# Patient Record
Sex: Female | Born: 1970
Health system: Southern US, Community
[De-identification: ages and names within clinical notes are randomized; demographics above are authoritative.]

## PROBLEM LIST (undated history)

## (undated) DIAGNOSIS — O99810 Abnormal glucose complicating pregnancy: Secondary | ICD-10-CM

## (undated) DIAGNOSIS — O24419 Gestational diabetes mellitus in pregnancy, unspecified control: Secondary | ICD-10-CM

## (undated) DIAGNOSIS — L309 Dermatitis, unspecified: Secondary | ICD-10-CM

## (undated) DIAGNOSIS — J45990 Exercise induced bronchospasm: Secondary | ICD-10-CM

## (undated) DIAGNOSIS — E282 Polycystic ovarian syndrome: Secondary | ICD-10-CM

## (undated) DIAGNOSIS — T7840XA Allergy, unspecified, initial encounter: Secondary | ICD-10-CM

## (undated) DIAGNOSIS — I10 Essential (primary) hypertension: Secondary | ICD-10-CM

## (undated) HISTORY — DX: Abnormal glucose complicating pregnancy: O99.810

## (undated) HISTORY — DX: Essential (primary) hypertension: I10

## (undated) HISTORY — DX: Allergy, unspecified, initial encounter: T78.40XA

## (undated) HISTORY — DX: Polycystic ovarian syndrome: E28.2

## (undated) HISTORY — DX: Gestational diabetes mellitus in pregnancy, unspecified control: O24.419

## (undated) HISTORY — DX: Exercise induced bronchospasm: J45.990

## (undated) HISTORY — PX: NO PAST SURGERIES: SHX2092

## (undated) HISTORY — PX: WISDOM TOOTH EXTRACTION: SHX21

## (undated) HISTORY — DX: Dermatitis, unspecified: L30.9

---

## 2007-03-05 ENCOUNTER — Ambulatory Visit: Payer: Self-pay | Admitting: Family Medicine

## 2007-03-05 DIAGNOSIS — N751 Abscess of Bartholin's gland: Secondary | ICD-10-CM | POA: Insufficient documentation

## 2007-03-05 DIAGNOSIS — Z8632 Personal history of gestational diabetes: Secondary | ICD-10-CM | POA: Insufficient documentation

## 2007-03-05 DIAGNOSIS — O99810 Abnormal glucose complicating pregnancy: Secondary | ICD-10-CM

## 2007-03-05 DIAGNOSIS — R300 Dysuria: Secondary | ICD-10-CM | POA: Insufficient documentation

## 2007-03-05 HISTORY — DX: Abnormal glucose complicating pregnancy: O99.810

## 2007-03-06 ENCOUNTER — Encounter: Payer: Self-pay | Admitting: Family Medicine

## 2007-03-08 LAB — CONVERTED CEMR LAB
Clue Cells Wet Prep HPF POC: NONE SEEN
Trich, Wet Prep: NONE SEEN
Yeast Wet Prep HPF POC: NONE SEEN

## 2007-05-03 ENCOUNTER — Ambulatory Visit: Payer: Self-pay | Admitting: Family Medicine

## 2007-05-03 DIAGNOSIS — J301 Allergic rhinitis due to pollen: Secondary | ICD-10-CM | POA: Insufficient documentation

## 2007-05-07 ENCOUNTER — Encounter: Payer: Self-pay | Admitting: Family Medicine

## 2007-05-07 LAB — CONVERTED CEMR LAB
ALT: 12 units/L (ref 0–35)
Alkaline Phosphatase: 83 units/L (ref 39–117)
CO2: 23 meq/L (ref 19–32)
Cholesterol: 198 mg/dL (ref 0–200)
Creatinine, Ser: 0.88 mg/dL (ref 0.40–1.20)
LDL Cholesterol: 117 mg/dL — ABNORMAL HIGH (ref 0–99)
Sodium: 139 meq/L (ref 135–145)
Total Bilirubin: 0.4 mg/dL (ref 0.3–1.2)
Total CHOL/HDL Ratio: 4.8
Total Protein: 7.7 g/dL (ref 6.0–8.3)
VLDL: 40 mg/dL (ref 0–40)

## 2007-05-09 ENCOUNTER — Encounter: Payer: Self-pay | Admitting: Family Medicine

## 2007-06-17 ENCOUNTER — Ambulatory Visit: Payer: Self-pay | Admitting: Family Medicine

## 2007-06-17 DIAGNOSIS — N949 Unspecified condition associated with female genital organs and menstrual cycle: Secondary | ICD-10-CM

## 2007-06-17 DIAGNOSIS — N938 Other specified abnormal uterine and vaginal bleeding: Secondary | ICD-10-CM | POA: Insufficient documentation

## 2007-10-13 ENCOUNTER — Ambulatory Visit: Payer: Self-pay | Admitting: Family Medicine

## 2007-10-13 DIAGNOSIS — N390 Urinary tract infection, site not specified: Secondary | ICD-10-CM | POA: Insufficient documentation

## 2007-10-13 DIAGNOSIS — H6980 Other specified disorders of Eustachian tube, unspecified ear: Secondary | ICD-10-CM | POA: Insufficient documentation

## 2007-10-13 LAB — CONVERTED CEMR LAB
Bilirubin Urine: NEGATIVE
Glucose, Urine, Semiquant: NEGATIVE
Protein, U semiquant: >=300
Urobilinogen, UA: 0.2
pH: 6

## 2007-10-15 ENCOUNTER — Encounter: Payer: Self-pay | Admitting: Family Medicine

## 2007-10-15 ENCOUNTER — Ambulatory Visit: Payer: Self-pay | Admitting: Family Medicine

## 2007-10-15 DIAGNOSIS — R0602 Shortness of breath: Secondary | ICD-10-CM | POA: Insufficient documentation

## 2007-10-18 ENCOUNTER — Telehealth: Payer: Self-pay | Admitting: Family Medicine

## 2007-10-21 ENCOUNTER — Telehealth (INDEPENDENT_AMBULATORY_CARE_PROVIDER_SITE_OTHER): Payer: Self-pay | Admitting: *Deleted

## 2007-11-16 ENCOUNTER — Encounter: Payer: Self-pay | Admitting: Family Medicine

## 2008-06-16 ENCOUNTER — Ambulatory Visit: Payer: Self-pay | Admitting: Family Medicine

## 2008-06-16 DIAGNOSIS — J069 Acute upper respiratory infection, unspecified: Secondary | ICD-10-CM | POA: Insufficient documentation

## 2008-06-16 DIAGNOSIS — S90129A Contusion of unspecified lesser toe(s) without damage to nail, initial encounter: Secondary | ICD-10-CM | POA: Insufficient documentation

## 2008-06-16 DIAGNOSIS — J209 Acute bronchitis, unspecified: Secondary | ICD-10-CM | POA: Insufficient documentation

## 2008-07-06 ENCOUNTER — Encounter: Payer: Self-pay | Admitting: Family Medicine

## 2008-07-17 ENCOUNTER — Ambulatory Visit: Payer: Self-pay | Admitting: Family Medicine

## 2008-07-17 DIAGNOSIS — R05 Cough: Secondary | ICD-10-CM

## 2008-07-17 DIAGNOSIS — R059 Cough, unspecified: Secondary | ICD-10-CM | POA: Insufficient documentation

## 2008-07-18 LAB — CONVERTED CEMR LAB
Basophils Absolute: 0 10*3/uL (ref 0.0–0.1)
Basophils Relative: 0 % (ref 0–1)
Eosinophils Absolute: 0.1 10*3/uL (ref 0.0–0.7)
Eosinophils Relative: 1 % (ref 0–5)
HCT: 39.3 % (ref 36.0–46.0)
MCV: 91.8 fL (ref 78.0–100.0)
Neutrophils Relative %: 74 % (ref 43–77)
Platelets: 237 10*3/uL (ref 150–400)
RDW: 14.5 % (ref 11.5–15.5)

## 2008-12-29 ENCOUNTER — Ambulatory Visit: Payer: Self-pay | Admitting: Family Medicine

## 2010-04-01 ENCOUNTER — Encounter: Payer: Self-pay | Admitting: Family Medicine

## 2010-04-25 NOTE — Letter (Signed)
Summary: [facility]  [facility]   Imported By: Lanelle Bal 04/16/2010 11:07:09  _____________________________________________________________________  External Attachment:    Type:   Image     Comment:   External Document

## 2010-05-20 ENCOUNTER — Ambulatory Visit (INDEPENDENT_AMBULATORY_CARE_PROVIDER_SITE_OTHER): Payer: Private Health Insurance - Indemnity | Admitting: Family Medicine

## 2010-05-20 ENCOUNTER — Encounter: Payer: Self-pay | Admitting: Family Medicine

## 2010-05-20 DIAGNOSIS — G5600 Carpal tunnel syndrome, unspecified upper limb: Secondary | ICD-10-CM

## 2010-05-24 ENCOUNTER — Telehealth: Payer: Self-pay | Admitting: Family Medicine

## 2010-05-30 ENCOUNTER — Telehealth: Payer: Self-pay | Admitting: Family Medicine

## 2010-05-30 NOTE — Assessment & Plan Note (Signed)
Summary: L hand pain   Vital Signs:  Patient profile:   40 year old female Height:      65.5 inches Weight:      160 pounds BMI:     26.32 O2 Sat:      98 % on Room air Pulse rate:   85 / minute BP sitting:   121 / 87  (left arm) Cuff size:   regular  Vitals Entered By: Payton Spark CMA (May 20, 2010 2:10 PM)  O2 Flow:  Room air CC: Stabbing pians in L hand radiate up arm x 1 week.   Primary Care Provider:  Seymour Bars DO  CC:  Stabbing pians in L hand radiate up arm x 1 week.Pamela Stevens  History of Present Illness: 40 yo WF presents for pain in the L hand that started 6 days ago.  She was doing a lot of typing.  She is R handed.  She had L sided carpal tunnel when she was present.  She has pain in the 1st and 2nd digit, mostly over the thenar eminence  and the pains shoot all the way up the L arm.  No tingling or numbness.  She feels achey in her arm.  Tylenol did not help.  No relief with ibuprofen.  She has had pain in the neck.  and radiating pain in the shoulder but no actual shoulder pain.  Denie forearm or hand swelling.    Dropped a pickle jar last wk due to loss of strength.  Has been holding her toddler on the L side and he is about 27 lbs now.     Current Medications (verified): 1)  Gnp Prenatal Vitamins   Tabs (Prenatal Vit-Fe Fumarate-Fa) .... Take 1 Tablet By Mouth Once A Day 2)  Camila 0.35 Mg Tabs (Norethindrone) 3)  Fish Oil 1000 Mg Caps (Omega-3 Fatty Acids)  Allergies (verified): No Known Drug Allergies  Past History:  Past Medical History: Reviewed history from 06/16/2008 and no changes required. Insulin dependent GDM migraines irregular menses  Spirometry 10-15-07: FVC 83%, FEV1 87%, ratio 105%, no albuterol response.   Social History: SAHM.  Toddler daughter ' Cammy Copa ' and son.  Married to [place]. Never smoked. Rare ETOH. 2 coffee/ day Plan to start going to gym. Fair diet, nursing.    Review of Systems      See HPI  Physical Exam  General:   alert, well-developed, well-nourished, and well-hydrated.   Head:  normocephalic and atraumatic.   Neck:  supple and no masses.   Lungs:  Normal respiratory effort, chest expands symmetrically. Lungs are clear to auscultation, no crackles or wheezes. Heart:  Normal rate and regular rhythm. S1 and S2 normal without gallop, murmur, click, rub or other extra sounds. Msk:  slightly limited C spine Rotation and SB bilat with some pain.  tight trap muscles, full glenohumeral ROM Pulses:  2+ radial and ulnar pulses Extremities:  no UE Edema Neurologic:  grip + 5/5 bilat + Phalen sign on the L Skin:  color normal.   Psych:  good eye contact, not anxious appearing, and not depressed appearing.     Impression & Recommendations:  Problem # 1:  CARPAL TUNNEL SYNDROME, LEFT (ICD-354.0) L carpal tunnel vs cervical radiculopathy.  Not responding to OTC tylenol (breastfeeding) or a night splint. Will order a nerve conduction study at [facility] to decipher where pain is coming from.   Will start her on Naproxen two times a day x 2 wks.  Also consider  dx of tendonopathy though with resting pain and tingling,  more likely to be nerve root compression.   Orders: Neurology Referral (Neuro)  Complete Medication List: 1)  Gnp Prenatal Vitamins Tabs (Prenatal vit-fe fumarate-fa) .... Take 1 tablet by mouth once a day 2)  Camila 0.35 Mg Tabs (Norethindrone) 3)  Fish Oil 1000 Mg Caps (Omega-3 fatty acids) 4)  Naproxen 500 Mg Tabs (Naproxen) .Pamela Stevens.. 1 tab by mouth two times a day with food x 2 wks  Other Orders: T-Comprehensive Metabolic Panel [id number]) T-Lipid Profile ([id number])  Patient Instructions: 1)  Will set up a nerve conduction study to figure out if pain is coming from your neck or your carpel tunnel. 2)  Start RX Naproxen 2 x a day with food for pain/ inflammation. 3)  REturn for f/u in 3 wks. Prescriptions: NAPROXEN 500 MG TABS (NAPROXEN) 1 tab by mouth two times a day with food x 2 wks  #28 x  0   Entered and Authorized by:   Seymour Bars DO   Signed by:   Seymour Bars DO on 05/20/2010   Method used:   [facility] to        [facility] [phone][facility] (retail)       [address] [place], [place]  [address]       Ph: [phone] or [phone]       Fax: [phone]   RxID:   [id number]    Orders Added: 1)  T-Comprehensive Metabolic Panel [[id number]] 2)  T-Lipid Profile [[id number]] 3)  Neurology Referral [Neuro] 4)  Est. Patient Level III [[address]606]

## 2010-05-30 NOTE — Progress Notes (Signed)
Summary: [facility]-Hand pain/swelling  Phone Note Call from Patient Call back at Home Phone [phone]   Caller: Patient Call For: Pamela Bars DO Summary of Call: Pt states she was cutting something last night and had a sharp pain that radiated to fingertips and up her arm.  After that pain she noticed that her arm/hand was swollen.  Swelling has gone down some today.  Pain continues to radiate to fingertip and she continues to get intermittent pinprick pain in all of her fingertips.  Pt would like to know if there is anything she may be able to take for pain.  Either a pain med or muscle relaxer.  Advised pt our options are limited due to breastfeeding.  Pt voices understanding.  Pt would like something for pain and would like to know if this pain is normal for carpal tunnel.  Please advise. [facility] [place] Initial call taken by: Francee Piccolo CMA Duncan Dull),  May 24, 2010 2:10 PM  Follow-up for Phone Call        1.  pt is alredy set up for EMG/ nerve conduction study 2.  I already gave her Naproxen for pain and inflammation. 3.  She can wear her night splint day and night if needed.  Nothing additional to add. Follow-up by: Pamela Bars DO,  May 24, 2010 4:33 PM     Appended Document: [facility]-Hand pain/swelling pt notified of above.  She has EMG study monday.

## 2010-06-03 DIAGNOSIS — G5620 Lesion of ulnar nerve, unspecified upper limb: Secondary | ICD-10-CM | POA: Insufficient documentation

## 2010-06-04 NOTE — Progress Notes (Addendum)
Summary: Hand pain  Phone Note Call from Patient   Caller: Patient Summary of Call: Pt states she had nerve conduction study and it didn't really show anything so she is wondering what the next step for her hand pain is. Please advise. Initial call taken by: Payton Spark CMA,  May 30, 2010 1:38 PM  Follow-up for Phone Call        Pls let pt know that I will put in a referral for ortho tomorrow. Follow-up by: Seymour Bars DO,  May 30, 2010 2:06 PM     Appended Document: Hand pain Pt aware of the above  Appended Document: Hand pain Pls let pt know that her nerve conduction study did show evidence of a mild L ulnar neuropathy, not sure if this is the cause of her pain.  Let me know if any problems getting in with ortho.  Will fax them a copy of her study.  Seymour Bars, D.O.  Appended Document: Hand pain

## 2010-06-07 ENCOUNTER — Telehealth (INDEPENDENT_AMBULATORY_CARE_PROVIDER_SITE_OTHER): Payer: Self-pay | Admitting: *Deleted

## 2010-06-11 NOTE — Progress Notes (Signed)
Summary: Naproxen refill  Phone Note Refill Request Call back at Home Phone [phone] Message from:  Patient on June 07, 2010 8:34 AM  Refills Requested: Medication #1:  NAPROXEN 500 MG TABS 1 tab by mouth two times a day with food x 2 wks.  Method Requested: Electronic Initial call taken by: Lannette Donath,  June 07, 2010 8:34 AM    Prescriptions: NAPROXEN 500 MG TABS (NAPROXEN) 1 tab by mouth two times a day with food x 2 wks  #28 x 0   Entered by:   Payton Spark CMA   Authorized by:   Seymour Bars DO   Signed by:   Payton Spark CMA on 06/07/2010   Method used:   Electronically to        [facility] [phone]* (retail)       [address] [place], [place]  [address]       Ph: [phone] or [phone]       Fax: [phone]   RxID:   [id number]

## 2010-08-06 ENCOUNTER — Encounter: Payer: Self-pay | Admitting: Family Medicine

## 2010-08-06 ENCOUNTER — Ambulatory Visit (INDEPENDENT_AMBULATORY_CARE_PROVIDER_SITE_OTHER): Payer: Private Health Insurance - Indemnity | Admitting: Family Medicine

## 2010-08-06 VITALS — BP 119/80 | HR 88 | Temp 98.8°F | Ht 66.0 in | Wt 162.0 lb

## 2010-08-06 DIAGNOSIS — H6590 Unspecified nonsuppurative otitis media, unspecified ear: Secondary | ICD-10-CM

## 2010-08-06 NOTE — Progress Notes (Signed)
  Subjective:    Patient ID: Pamela Stevens, female    DOB: June 25, 1970, 40 y.o.   MRN: [id number]  HPI  40 yo WF presents for bilat ear pain.  She had a cold a few wks ago.  She is breastfeeding.  Both of her kids have had URI/ ear infections.  She had had chills but no fevers.  She has muffled hearing and popping sensation.  She no longer has sore throat or rhinorrhea.  She denies dizziness but has tinnitus.  She's only had one previous ear infection.  BP 119/80  Pulse 88  Temp(Src) 98.8 F (37.1 C) (Oral)  Ht 5\' 6"  (1.676 m)  Wt 162 lb (73.483 kg)  BMI 26.15 kg/m2  SpO2 100%  LMP 08/04/2010    Review of Systems as per HPI     Objective:   Physical Exam  Constitutional: She appears well-developed and well-nourished. No distress.  HENT:  Head: Normocephalic and atraumatic.  Right Ear: External ear and ear canal normal. No drainage or tenderness. Tympanic membrane is not injected and not perforated. A middle ear effusion is present. Decreased hearing is noted.  Left Ear: External ear and ear canal normal. No tenderness. Tympanic membrane is not injected and not perforated. A middle ear effusion is present. Decreased hearing is noted.  Nose: Mucosal edema and rhinorrhea present. Right sinus exhibits no maxillary sinus tenderness and no frontal sinus tenderness. Left sinus exhibits no maxillary sinus tenderness and no frontal sinus tenderness.  Mouth/Throat: Mucous membranes are normal. No uvula swelling. No oropharyngeal exudate, posterior oropharyngeal edema or posterior oropharyngeal erythema.  Cardiovascular: Normal rate, regular rhythm and normal heart sounds.   No murmur heard. Pulmonary/Chest: Effort normal and breath sounds normal.  Lymphadenopathy:    She has cervical adenopathy.  Skin: Skin is warm and dry. No rash noted.  Psychiatric: She has a normal mood and affect.          Assessment & Plan:  1.  bilat ear effusions following a URI - no sign of bacterial  infection.  Will treat with claritin D OTC.  If not seeing improvement in symptoms after 4-5 days, please call.

## 2010-08-06 NOTE — Patient Instructions (Signed)
Start Clartin D for ear effusion.  Call me if pain/ pressure as not resolved by Friday.

## 2010-08-09 ENCOUNTER — Telehealth: Payer: Self-pay | Admitting: *Deleted

## 2010-08-09 DIAGNOSIS — H9209 Otalgia, unspecified ear: Secondary | ICD-10-CM

## 2010-08-09 NOTE — Telephone Encounter (Signed)
Ok.  As discussed, I will put in for an ENT referral.

## 2010-08-09 NOTE — Telephone Encounter (Signed)
Pt aware of the above  

## 2010-08-09 NOTE — Telephone Encounter (Signed)
Pt states ear is no better. Please advise.

## 2011-05-16 ENCOUNTER — Encounter: Payer: Self-pay | Admitting: Family Medicine

## 2011-05-16 ENCOUNTER — Ambulatory Visit (INDEPENDENT_AMBULATORY_CARE_PROVIDER_SITE_OTHER): Payer: Private Health Insurance - Indemnity | Admitting: Family Medicine

## 2011-05-16 VITALS — BP 121/86 | HR 87 | Ht 66.0 in | Wt 165.0 lb

## 2011-05-16 DIAGNOSIS — N390 Urinary tract infection, site not specified: Secondary | ICD-10-CM

## 2011-05-16 DIAGNOSIS — B351 Tinea unguium: Secondary | ICD-10-CM

## 2011-05-16 DIAGNOSIS — L259 Unspecified contact dermatitis, unspecified cause: Secondary | ICD-10-CM

## 2011-05-16 DIAGNOSIS — L309 Dermatitis, unspecified: Secondary | ICD-10-CM

## 2011-05-16 LAB — POCT URINALYSIS DIPSTICK
Spec Grav, UA: >=1.03
Urobilinogen, UA: 0.2
pH, UA: 5.5

## 2011-05-16 LAB — COMPLETE METABOLIC PANEL WITH GFR
Albumin: 5 g/dL (ref 3.5–5.2)
Alkaline Phosphatase: 71 U/L (ref 39–117)
BUN: 17 mg/dL (ref 6–23)
Creat: 1.05 mg/dL (ref 0.50–1.10)
GFR, Est Non African American: 67 mL/min
Glucose, Bld: 99 mg/dL (ref 70–99)
Potassium: 4.7 mEq/L (ref 3.5–5.3)
Total Bilirubin: 0.4 mg/dL (ref 0.3–1.2)

## 2011-05-16 MED ORDER — CIPROFLOXACIN HCL 500 MG PO TABS: 500.0000 mg | ORAL_TABLET | Freq: Two times a day (BID) | ORAL | Status: AC

## 2011-05-16 MED ORDER — TERBINAFINE HCL 250 MG PO TABS: 250.0000 mg | ORAL_TABLET | Freq: Every day | ORAL | Status: DC

## 2011-05-16 NOTE — Progress Notes (Signed)
  Subjective:    Patient ID: Pamela Stevens, female    DOB: 03-20-1971, 41 y.o.   MRN: [id number]  HPI 4 weeks of dysuria, urgency. No fever.  Mild back pain.  Also having some neck pain as well.  No hematuria.    Right great toe with nail fungus. Couldn't treat it before since was pregnant. She is not breast feeding. It has never been cultured. She would like to try fungal treatment. She has not hx of liver problems.   Rash on right fingerpad shw would like me to look at. She says it has been getting worse lately. She has not been using anything on it. She would like to which she can use. No prior history of eczema.  Review of Systems     Objective:   Physical Exam  Constitutional: She appears well-developed and well-nourished.  Cardiovascular: Normal rate, regular rhythm and normal heart sounds.   Pulmonary/Chest: Effort normal and breath sounds normal.  Musculoskeletal:       No CVA tenderness.   Skin:       On her great toe on her right foot she has some white thickening of the lateral nail border. On her first thing on her right hand she also has some skin that is peeling, thickened with a few vesicles. No actual breaks in the skin or open wounds.          Assessment & Plan:  UTI- UA + for trace LE and blood. Will tx with Cipro x 3 days. Call if symptoms not completely resolved in 5 days. Work on hydrating and emptying the bladder regularly.  Onychomycosis-we discussed using Lamisil to try to treat her nail. We did not collect a fungal culture. If their insurance requires this then she can come back and we can collect a sample.Check baseline LFTs.    Dyshidrotic eczema on the right finger-to assist him with topical steroid and using lots of lotions or emoillents.  She would like to hold off on steroid if possible and we'll try just lots of lotion moisturizers on the finger first. Angelica Leal his back if she changes her mind and we can consider a trial of triamcinolone  cream.  Cholesterol-she said she had blood work checked at work about 4 weeks ago. I asked her to fax [place] a copy of this for her records.

## 2011-05-16 NOTE — Progress Notes (Signed)
Quick Note:  All labs are normal. ______ 

## 2011-05-16 NOTE — Patient Instructions (Signed)
Call if symptoms not completely resolved in 5 days. Work on hydrating and emptying the bladder regularly.

## 2012-08-06 ENCOUNTER — Encounter: Payer: Self-pay | Admitting: Family Medicine

## 2012-08-06 ENCOUNTER — Ambulatory Visit (INDEPENDENT_AMBULATORY_CARE_PROVIDER_SITE_OTHER): Payer: Private Health Insurance - Indemnity | Admitting: Family Medicine

## 2012-08-06 VITALS — BP 126/93 | HR 79 | Wt 167.0 lb

## 2012-08-06 DIAGNOSIS — B351 Tinea unguium: Secondary | ICD-10-CM

## 2012-08-06 DIAGNOSIS — H109 Unspecified conjunctivitis: Secondary | ICD-10-CM

## 2012-08-06 MED ORDER — TERBINAFINE HCL 250 MG PO TABS: 250.0000 mg | ORAL_TABLET | Freq: Every day | ORAL | Status: AC

## 2012-08-06 MED ORDER — MOXIFLOXACIN HCL 0.5 % OP SOLN - NO CHARGE: 1.0000 [drp] | Freq: Three times a day (TID) | OPHTHALMIC | Status: DC

## 2012-08-06 NOTE — Progress Notes (Signed)
  Subjective:    Patient ID: Pamela Stevens, female    DOB: [birth year], 42 y.o.   MRN: [id number]  HPI Conjunctivitis that started last night. Both of her kids have been battling the same symptoms are currently on treatment. It started in her right eye and is now moving to her left. Feels itchy and scratchey.  Vision is a litlte blurry.  Lid feels swollen. No fever Or URI. Does have mild seasonal allergies.   ONychomycosis - Says was initially treated with Lamisil a couple years ago by Dr. Seymour Bars. She feels like it's starting to come back and wants to know if she can refill the Lamisil prescription.   Review of Systems     Objective:   Physical Exam  Constitutional: She is oriented to person, place, and time. She appears well-developed and well-nourished.  HENT:  Head: Normocephalic and atraumatic.  Right Ear: External ear normal.  Left Ear: External ear normal.  Nose: Nose normal.  Mouth/Throat: Oropharynx is clear and moist.  TMs and canals are clear. Sclera are injected bilaterally but worse on the right. Her right upper and lower eyelids are mildly edematous and erythematous as well. No active drainage from the corner of the eye. Some crusting along the liver. On the right.  Eyes: Conjunctivae and EOM are normal. Pupils are equal, round, and reactive to light.  Neck: Neck supple. No thyromegaly present.  Pulmonary/Chest: She has no wheezes.  Lymphadenopathy:    She has no cervical adenopathy.  Neurological: She is alert and oriented to person, place, and time.  Skin: Skin is warm and dry.  Right foot great toenail it black and thick on the end.   Psychiatric: She has a normal mood and affect.          Assessment & Plan:  Conjunctivitis - will treat with Vigamox eyedrops. If not significantly better in one week then please call me back. If she notices any significant change in vision and please call immediately.  Onychomycosis-will recheck liver enzymes today  before starting Lamisil. Oral prescription sent to pharmacy. Call if any problems with the medication.

## 2012-08-06 NOTE — Patient Instructions (Signed)
Conjunctivitis Conjunctivitis is commonly called "pink eye." Conjunctivitis can be caused by bacterial or viral infection, allergies, or injuries. There is usually redness of the lining of the eye, itching, discomfort, and sometimes discharge. There may be deposits of matter along the eyelids. A viral infection usually causes a watery discharge, while a bacterial infection causes a yellowish, thick discharge. Pink eye is very contagious and spreads by direct contact. You may be given antibiotic eyedrops as part of your treatment. Before using your eye medicine, remove all drainage from the eye by washing gently with warm water and cotton balls. Continue to use the medication until you have awakened 2 mornings in a row without discharge from the eye. Do not rub your eye. This increases the irritation and helps spread infection. Use separate towels from other household members. Wash your hands with soap and water before and after touching your eyes. Use cold compresses to reduce pain and sunglasses to relieve irritation from light. Do not wear contact lenses or wear eye makeup until the infection is gone. SEEK MEDICAL CARE IF:   Your symptoms are not better after 3 days of treatment.  You have increased pain or trouble seeing.  The outer eyelids become very red or swollen. Document Released: 04/17/2004 Document Revised: 06/02/2011 Document Reviewed: 03/10/2005 Exit[employer] Patient Information 2013 [employer].  

## 2012-08-07 LAB — HEPATIC FUNCTION PANEL
ALT: 13 U/L (ref 0–35)
AST: 16 U/L (ref 0–37)
Indirect Bilirubin: 0.4 mg/dL (ref 0.0–0.9)
Total Protein: 7.7 g/dL (ref 6.0–8.3)

## 2012-08-08 NOTE — Progress Notes (Signed)
Quick Note:  All labs are normal. ______ 

## 2012-08-10 ENCOUNTER — Other Ambulatory Visit: Payer: Self-pay | Admitting: *Deleted

## 2012-08-10 MED ORDER — MOXIFLOXACIN HCL 0.5 % OP SOLN - NO CHARGE: 1.0000 [drp] | Freq: Three times a day (TID) | OPHTHALMIC | Status: DC

## 2012-10-04 ENCOUNTER — Encounter: Payer: Self-pay | Admitting: Family Medicine

## 2012-10-04 ENCOUNTER — Ambulatory Visit (INDEPENDENT_AMBULATORY_CARE_PROVIDER_SITE_OTHER): Payer: Worker's Compensation | Admitting: Family Medicine

## 2012-10-04 VITALS — BP 125/91 | HR 76 | Wt 169.0 lb

## 2012-10-04 DIAGNOSIS — M5030 Other cervical disc degeneration, unspecified cervical region: Secondary | ICD-10-CM

## 2012-10-04 DIAGNOSIS — S060X0A Concussion without loss of consciousness, initial encounter: Secondary | ICD-10-CM

## 2012-10-04 MED ORDER — HYDROCODONE-ACETAMINOPHEN 5-325 MG PO TABS: ORAL_TABLET | ORAL | Status: DC

## 2012-10-04 MED ORDER — ONDANSETRON HCL 4 MG PO TABS: ORAL_TABLET | ORAL | Status: DC

## 2012-10-04 NOTE — Progress Notes (Signed)
Claim [id number] Pamela Stevens [phone] Ext [id number]  CC: Pamela Stevens is a 42 y.o. female is here for ER f/u   Subjective: HPI:  Patient complains of nausea, posterior headache, mental sluggishness, dizziness and trouble concentrating and has been present ever since hitting her head on the back of a table and then on the ground without losing consciousness after falling from a table at work. This occurred July 11 she was evaluated at [facility] with records showing unremarkable right elbow x-ray CT scan of the cervical spine showing no acute fracture but multilevel degenerative disc disease most prominent C5-C6 central stenosis.  Fortunately she denies any motor or sensory disturbances in the appendages nor weakness nor saddle paresthesia nor bowel or bladder incontinence recently or remotely. She tells me CT scan of the head was also obtained which was negative results are not available for this.  Symptoms began somewhat suddenly after the injury above. Treatment has included staying at home. Symptoms were worsened when returning to work this morning. Symptoms are somewhat worsened with emotional liability when watching TV, dizziness is worsened with sudden movements otherwise all symptoms seem to be constant and unchanged mild to moderate severity since onset. Symptoms are present all hours of the day. She reports she had a concussion approximately 20 years ago but no head injury since then. She denies fevers, chills, abdominal pain, chest pain, neck pain, coordination difficulty, irregular heartbeat, nor vision loss or hearing loss. She's been using ibuprofen without much help of her headache which is described as posterior nonradiating mild in severity nothing makes better or worse other than above   Review Of Systems Outlined In HPI  Past Medical History  Diagnosis Date  . DIABETES MELLITUS, GESTATIONAL, INSULIN-DEPENDENT 03/05/2007    Qualifier: Diagnosis of  By: [name]       History reviewed. No pertinent family history.   History  Substance Use Topics  . Smoking status: Never Smoker   . Smokeless tobacco: Not on file  . Alcohol Use: Not on file     Objective: Filed Vitals:   10/04/12 1135  BP: 125/91  Pulse: 76    General: Alert and Oriented, No Acute Distress HEENT: Pupils equal, round, reactive to light. Conjunctivae clear.  External ears unremarkable, canals clear with intact TMs with appropriate landmarks.  Middle ear appears open without effusion. Pink inferior turbinates.  Moist mucous membranes, pharynx without inflammation nor lesions.  Neck supple without palpable lymphadenopathy nor abnormal masses. Lungs: Clear to auscultation bilaterally, no wheezing/ronchi/rales.  Comfortable work of breathing. Good air movement. Cardiac: Regular rate and rhythm. Normal S1/S2.  No murmurs, rubs, nor gallops.   Neuro: CN II-XII grossly intact, full strength/rom of all four extremities, C5/L4/S1 DTRs 2/4 bilaterally, gait normal, rapid alternating movements normal, heel-shin test normal, Rhomberg normal. Extremities: No peripheral edema.  Strong peripheral pulses.  Mental Status: No depression, anxiety, nor agitation. Skin: Warm and dry.  Orientation 5 out of 5 Immediate memory 15 out of 15 Concentration: Able to recite up to 5 random digits backwards, months in reverse order 12 out of 12 She has full range of motion and strength of the neck without midline tenderness. With her nondominant hand she has no trouble switching from her nose to my finger in rapid succession Tandem stance with nondominant foot at back: She has difficulty keeping her eyes closed for more than 3 seconds after 10 seconds of testing  Assessment & Plan: Pamela Stevens was seen today for  er f/u.  Diagnoses and associated orders for this visit:  Degenerative disc disease, cervical  Concussion with no loss of consciousness, initial encounter - ondansetron (ZOFRAN) 4 MG  tablet; 1-2 tabs by mouth every 8 hours as needed for nausea. - HYDROcodone-acetaminophen (NORCO/VICODIN) 5-325 MG per tablet; One by mouth every 6 hours only as needed for pain.    Discussed with patient's her constellation of symptoms are classic for a concussion. We discussed the benefits and importance of strict brain rest avoiding auditory and visual stimulation such as that from TVs, cell phones, computers, loud environments, et cetera. I've encouraged her to focus on a very boring next 3 days to help with brain rest. I would like her to be out of work for at least 3 days she may return on Thursday if significantly improving.Signs and symptoms requring emergent/urgent reevaluation were discussed with the patient. Return in about 4 days (around 10/08/2012).

## 2012-10-07 ENCOUNTER — Ambulatory Visit (INDEPENDENT_AMBULATORY_CARE_PROVIDER_SITE_OTHER): Payer: Worker's Compensation | Admitting: Family Medicine

## 2012-10-07 ENCOUNTER — Encounter: Payer: Self-pay | Admitting: Family Medicine

## 2012-10-07 VITALS — BP 130/95 | HR 79 | Wt 167.0 lb

## 2012-10-07 DIAGNOSIS — F0781 Postconcussional syndrome: Secondary | ICD-10-CM

## 2012-10-07 DIAGNOSIS — R51 Headache: Secondary | ICD-10-CM

## 2012-10-07 NOTE — Progress Notes (Signed)
Claim [id number]  Pamela Stevens [phone] Ext [id number]  CC: Pamela Stevens is a 42 y.o. female is here for Back Pain   Subjective: HPI:  Patient complains of continued symptoms since head injury on July 11. She complains of a headache is described as moderate in severity localized to the back of the head that radiates centrally it is worse with bright lights loud noise or when moving her head. She complains of nausea that is present all hours of the day mild to moderate in severity accompanied bilateral appetite this is also worse with movement. Nothing particularly makes it worse. She complains of waves of emotion and crying spells that occur for no particular reason and occur anytime of the day occurring somewhere between one and 2 times a day. She complains of continued dizziness that is worse with any movement of her head, she denies falls but feels unsteady with a rise) She does not believe that any of the above symptoms have gotten much better. She has been confined herself to home avoiding as much stimulation as possible, she has been following my recommendations on brain rest for the last 3 days. She denies any new motor or sensory disturbances, she specifically denies any tingling numbness or weakness in the upper or lower extremities. She denies saddle paresthesia, bowel or bladder incontinence nor worsening of any of the above symptoms. She denies fevers, chills, chest pain, shortness of breath, abdominal pain    Review Of Systems Outlined In HPI  Past Medical History  Diagnosis Date  . DIABETES MELLITUS, GESTATIONAL, INSULIN-DEPENDENT 03/05/2007    Qualifier: Diagnosis of  By: [name]       History reviewed. No pertinent family history.   History  Substance Use Topics  . Smoking status: Never Smoker   . Smokeless tobacco: Not on file  . Alcohol Use: Not on file     Objective: Filed Vitals:   10/07/12 0820  BP: 130/95  Pulse: 79    General: Alert and  Oriented, No Acute Distress HEENT: Pupils equal, round, reactive to light. Conjunctivae clear.  External ears unremarkable, canals clear with intact TMs with appropriate landmarks.  Middle ear appears open without effusion. Pink inferior turbinates.  Moist mucous membranes, pharynx without inflammation nor lesions.  Neck supple without palpable lymphadenopathy nor abnormal masses. Neuro: CN II-XII grossly intact, full strength/rom of all four extremities, C5/L4/S1 DTRs 2/4 bilaterally, gait normal, rapid alternating movements normal, heel-shin test normal, Rhomberg normal. Lungs: Clear to auscultation bilaterally, no wheezing/ronchi/rales.  Comfortable work of breathing. Good air movement. Cardiac: Regular rate and rhythm. Normal S1/S2.  No murmurs, rubs, nor gallops.   Extremities: No peripheral edema.  Strong peripheral pulses.  Mental Status: No depression, anxiety, nor agitation. Skin: Warm and dry.  Orientation 5 out of 5 Immediate memory 15 out of 15 Concentration digits backwards up to 5 digits backwards Month in reverse order perfect Right dominant foot forward tandem stance she is able to stand for 10 seconds without opening her eyes with swaying of the hips only twice Nondominant finger to nose without difficulty Testing extraocular muscles reproduces nausea and dizziness  Assessment & Plan: Candiss was seen today for back pain.  Diagnoses and associated orders for this visit:  Post concussive syndrome - Ambulatory referral to Neurology - CT Head Wo Contrast; Future  Headache(784.0) - CT Head Wo Contrast; Future    Postconcussion syndrome with headache: Fortunately does not seem that she has regressed on objective and subjective measures  but there is lack of much improvement despite brain rest. Given persistent symptoms and headaches I would like to rule out subdural hematoma with a CT scan, this is being scheduled through her workers compensation claim. Additionally joint  decision for neurology referral which will also be based through workers compensation options. Continue brain rest, out of work until results are available.Signs and symptoms requring emergent/urgent reevaluation were discussed with the patient. Follow we based on above results   Return in about 1 week (around 10/14/2012).

## 2012-10-08 ENCOUNTER — Telehealth: Payer: Self-pay | Admitting: *Deleted

## 2012-10-08 NOTE — Telephone Encounter (Signed)
I'd encourage her to be out of work until her CT scan results are available.  I'll write her out until then and will place up front in the drawers.

## 2012-10-08 NOTE — Telephone Encounter (Signed)
Pt left a message stating that her 2nd CT is scheduled for tues at [facility]. Her work note covers her for yesterday and today.  Does she need to come back in before her ct? Does she need to stay out of work until results come back?

## 2012-10-08 NOTE — Telephone Encounter (Signed)
Colleague at [facility] relayed to me that radiology report at the time of this note showed a normal CT scan of the head, this was relayed to the patient we will followup with her on Monday to ensure appointment at [facility] neurology is in the works

## 2012-10-08 NOTE — Telephone Encounter (Signed)
Called pt to inform her of work note & she told me that she was just contacted by [facility] & they are getting her in today at 4:00 for her ct.

## 2012-10-08 NOTE — Telephone Encounter (Signed)
CT was schedule yesterday through an outside company called  [employer]?. CT was scheduled for 7/22. Pamela Stevens from [employer] called to let me know this and she felt this was too late to wait. i agreed so I had to call back and try it get it scheduled asap. I spoke with w/ a rep there who states they will try to get CT scheduled for today but she may have to go somewhere that is out of network for her. As of right now she is scheduled to go to [facility]. I told them that it was fine to schedule her anywhere. The rep states they will work on this and call me once the appt has been scheduled to let me know date.

## 2012-10-11 ENCOUNTER — Telehealth: Payer: Self-pay | Admitting: *Deleted

## 2012-10-11 DIAGNOSIS — M62838 Other muscle spasm: Secondary | ICD-10-CM

## 2012-10-11 MED ORDER — ORPHENADRINE CITRATE ER 100 MG PO TB12: 100.0000 mg | ORAL_TABLET | Freq: Two times a day (BID) | ORAL | Status: AC | PRN

## 2012-10-11 NOTE — Telephone Encounter (Signed)
Pt notified and pt wants a rx for norflex. She was given a small quantity in the ER when she was first seen after the fall

## 2012-10-11 NOTE — Telephone Encounter (Signed)
Rx sent to [facility]

## 2012-10-11 NOTE — Telephone Encounter (Signed)
Pamela Stevens, Will you please let Shundra know that I called [facility] today and they didn't have her progress notes yet so I had our office re-send these.  If she is not scheduled for a neurology appointment before Wednesday then I'd like her to f/u with me to see if she can be cleared to return to work, for now I'd still encourage "brain rest".

## 2012-10-11 NOTE — Telephone Encounter (Signed)
Pt called and wants to know what to do now since she is aware of CT results. It looks like progress notes have been faxed to neurology and they have to be reviewed by them before they schedule appt since its workmans comp case

## 2012-10-12 NOTE — Telephone Encounter (Signed)
Pt.notified

## 2012-10-13 ENCOUNTER — Encounter: Payer: Self-pay | Admitting: Family Medicine

## 2012-10-13 ENCOUNTER — Ambulatory Visit (INDEPENDENT_AMBULATORY_CARE_PROVIDER_SITE_OTHER): Payer: Worker's Compensation | Admitting: Family Medicine

## 2012-10-13 VITALS — BP 124/90 | Wt 169.0 lb

## 2012-10-13 DIAGNOSIS — F0781 Postconcussional syndrome: Secondary | ICD-10-CM | POA: Insufficient documentation

## 2012-10-13 NOTE — Progress Notes (Signed)
CC: Pamela Stevens is a 42 y.o. female is here for f/u concussion   Subjective: HPI:  Claim [id number]  Pamela Stevens [phone] Ext [phone]    patient returns for followup of postconcussive syndrome following a blow to the head x2 while at work on July 11. Since I saw her last she has had a noncontrast CT which ruled out subdural hematoma. Since I saw her last she has been 100% compliant with brain rest minimizing auditory and visual stimuli. She is happy to report that she is no longer having emotional symptoms nor nausea. She also believes that her train of thought has improved but she is having lingering work finding difficulty, words in mid sentence seem to be at the tip of her tongue so to speak. She also has had an improved dizziness although this occurs to a mild degree all waking hours with sudden movements. Her biggest frustration is lingering headaches that occur suddenly when standing quickly or moving quickly in linger for 15 minutes slowly subsiding localized to the back of the head radiating forward. Improves with rest nothing else makes better or worse. Her neck pain is almost gone but she still describes a tightness that she occasionally has to take Norflex at night that completely resolves this pain.  She denies any new motor or sensory disturbances.  She expresses an interest in returning back to work. She has not heard anything back from [facility] neurology referral.    Review Of Systems Outlined In HPI  Past Medical History  Diagnosis Date  . DIABETES MELLITUS, GESTATIONAL, INSULIN-DEPENDENT 03/05/2007    Qualifier: Diagnosis of  By: [name]       History reviewed. No pertinent family history.   History  Substance Use Topics  . Smoking status: Never Smoker   . Smokeless tobacco: Not on file  . Alcohol Use: Not on file     Objective: Filed Vitals:   10/13/12 1422  BP: 124/90    General: Alert and Oriented, No Acute Distress HEENT: Pupils equal,  round, reactive to light. Conjunctivae clear.  Pharynx unremarkable moist mucous membranes Neuro: CN II-XII grossly intact, full strength/rom of all four extremities, C5/L4/S1 DTRs 2/4 bilaterally, gait normal, rapid alternating movements normal, heel-shin test normal, Rhomberg normal. Lungs: Clear comfortable work of breathing Cardiac: Regular rate and rhythm.  Extremities: No peripheral edema.  Strong peripheral pulses.  Mental Status: No depression, anxiety, nor agitation. Occasionally pausing mid sentence when it comes to work finding. Skin: Warm and dry.  Orientation score 5 out of 5 Immediate memory 15 out of 15 Concentration digits backwards successfully up to 6 digits Months backward without difficulty Full range of motion and strength of neck no point tenderness Right dominant foot forward tandem stance she is able to stand for 10 seconds without opening nor moving Nondominant finger to nose without difficulty Extraocular muscle testing does not reproduce dizziness or nausea  Assessment & Plan: Pamela Stevens was seen today for f/u concussion.  Diagnoses and associated orders for this visit:  Postconcussion syndrome    Discussed with patient I do believe she is safe to go back to work however due to lingering work finding difficulty and persistent headaches I do feel that she still wants neurology referral, we are awaiting a callback from [facility] neurology in hopes of finding why she is not been contacted by the appointment yet. We discussed that regression of her progress would require further brain rest. May continue Norflex on an as-needed basis.  25 minutes spent face-to-face during visit today of which at least 50% was counseling or coordinating care regarding postconcussion syndrome   Return if symptoms worsen or fail to improve.

## 2012-10-14 ENCOUNTER — Telehealth: Payer: Self-pay | Admitting: *Deleted

## 2012-10-14 NOTE — Telephone Encounter (Signed)
Pt called and states she had a really hard time at work today. The bright lights hurt her eyes and the pattern on the carpet made her nauseous. Pt also states when someone would ask her questions she couldn't find the words to formulate a response. She does have an appt with [facility] 10-27-15 in . It may be best if she goes back to work half days for a while per pt

## 2012-10-15 ENCOUNTER — Encounter: Payer: Self-pay | Admitting: Family Medicine

## 2012-10-15 NOTE — Telephone Encounter (Signed)
Work note describing recommendation for half days has been printed and placed in Pamela Stevens's inbox.

## 2012-10-15 NOTE — Telephone Encounter (Signed)
Patient aware and letter is up front.

## 2013-01-27 ENCOUNTER — Other Ambulatory Visit: Payer: Self-pay

## 2013-10-17 ENCOUNTER — Encounter: Payer: Self-pay | Admitting: Family Medicine

## 2013-10-17 ENCOUNTER — Ambulatory Visit (INDEPENDENT_AMBULATORY_CARE_PROVIDER_SITE_OTHER): Payer: BC Managed Care – PPO | Admitting: Family Medicine

## 2013-10-17 VITALS — BP 126/89 | HR 90 | Ht 66.0 in | Wt 165.0 lb

## 2013-10-17 DIAGNOSIS — R350 Frequency of micturition: Secondary | ICD-10-CM

## 2013-10-17 DIAGNOSIS — R221 Localized swelling, mass and lump, neck: Secondary | ICD-10-CM

## 2013-10-17 DIAGNOSIS — N390 Urinary tract infection, site not specified: Secondary | ICD-10-CM

## 2013-10-17 DIAGNOSIS — R220 Localized swelling, mass and lump, head: Secondary | ICD-10-CM

## 2013-10-17 LAB — POCT URINALYSIS DIPSTICK
BILIRUBIN UA: NEGATIVE
Glucose, UA: NEGATIVE
Ketones, UA: NEGATIVE
Nitrite, UA: POSITIVE
Protein, UA: 300
Spec Grav, UA: >=1.03
UROBILINOGEN UA: 0.2
pH, UA: 5.5

## 2013-10-17 MED ORDER — CIPROFLOXACIN HCL 500 MG PO TABS: 500.0000 mg | ORAL_TABLET | Freq: Two times a day (BID) | ORAL | Status: AC

## 2013-10-17 NOTE — Patient Instructions (Signed)

## 2013-10-17 NOTE — Progress Notes (Signed)
   Subjective:    Patient ID: Pamela Stevens, female    DOB: Aug 10, 1970, [age] y.o.   MRN: [id number]  HPI Urinary sxs x 5 days with foul smelling odor. Looks cloudy.  Has had low grade fever, chills x 5 days as well. Has noticed foam in urine.  No urgency.  + low back pain. No suprabupic pain.  No hematuria.    She's also noticed an intermittent discomfort in her neck. She says sometimes she'll feel like something is swollen and then the back of her neck almost like when a pill gets stuck. But it's not related to actually eating. She says it's uncomfortable but not painful. And will last usually 2-3 days before it resolves. She says this has happened several times. No known triggers or alleviating factors. She's not having any vomiting or regurgitation of food. Review of Systems     Objective:   Physical Exam  Constitutional: She is oriented to person, place, and time. She appears well-developed and well-nourished.  HENT:  Head: Normocephalic and atraumatic.  Cardiovascular: Normal rate, regular rhythm and normal heart sounds.   Pulmonary/Chest: Effort normal and breath sounds normal.  Musculoskeletal:  No CVA tenderness.   Neurological: She is alert and oriented to person, place, and time.  Skin: Skin is warm and dry.  Psychiatric: She has a normal mood and affect. Her behavior is normal.          Assessment & Plan:  UTI - Will tx with Cipro. Call if not better in one week. Make sure taking plenty of fluids. Okay for symptomatic care.  Throat swelling-unclear etiology. Most sounds like it could be something allergic since it lasts for couple days before it finally resolved. Could be a food allergy. Recommend keeping a food diary to see if she can determine any triggers. Also offered to do some food allergen testing for further evaluation. If this is unfruitful then recommend referral to ENT for further evaluation. Could also be esophagitis.

## 2013-12-16 ENCOUNTER — Encounter: Payer: Self-pay | Admitting: Family Medicine

## 2013-12-16 ENCOUNTER — Ambulatory Visit (INDEPENDENT_AMBULATORY_CARE_PROVIDER_SITE_OTHER): Payer: BC Managed Care – PPO | Admitting: Family Medicine

## 2013-12-16 VITALS — BP 124/95 | HR 82 | Ht 65.5 in | Wt 173.0 lb

## 2013-12-16 DIAGNOSIS — Z0000 Encounter for general adult medical examination without abnormal findings: Secondary | ICD-10-CM

## 2013-12-16 DIAGNOSIS — R631 Polydipsia: Secondary | ICD-10-CM | POA: Diagnosis not present

## 2013-12-16 LAB — POCT GLYCOSYLATED HEMOGLOBIN (HGB A1C): Hemoglobin A1C: 5.6

## 2013-12-16 NOTE — Progress Notes (Signed)
  Subjective:     Pamela Stevens is a 43 y.o. female and is here for a comprehensive physical exam. The patient reports problems - dry mouth, wiht inc thirst and urination. + fam hx of DM. wants to be checked. hx of gestational DM.   Sees Dr. Sedalia Muta for OB/GYN. Says last pap was in 2014.    History   Social History  . Marital Status: Married    Spouse Name: N/A    Number of Children: N/A  . Years of Education: N/A   Occupational History  . Not on file.   Social History Main Topics  . Smoking status: Never Smoker   . Smokeless tobacco: Not on file  . Alcohol Use: Not on file  . Drug Use: Not on file  . Sexual Activity: Not on file   Other Topics Concern  . Not on file   Social History Narrative  . No narrative on file   Health Maintenance  Topic Date Due  . Influenza Vaccine  10/22/2013  . Pap Smear  04/15/2014  . Tetanus/tdap  05/15/2021    The following portions of the patient's history were reviewed and updated as appropriate: allergies, current medications, past family history, past medical history, past social history, past surgical history and problem list.  Review of Systems A comprehensive review of systems was negative.   Objective:    There were no vitals taken for this visit. General appearance: alert, cooperative and appears stated age Head: Normocephalic, without obvious abnormality, atraumatic Eyes: conj clear, EOMi PEERLA Ears: normal TM's and external ear canals both ears Nose: Nares normal. Septum midline. Mucosa normal. No drainage or sinus tenderness. Throat: lips, mucosa, and tongue normal; teeth and gums normal Neck: no adenopathy, no carotid bruit, no JVD, supple, symmetrical, trachea midline and thyroid not enlarged, symmetric, no tenderness/mass/nodules Back: symmetric, no curvature. ROM normal. No CVA tenderness. Lungs: clear to auscultation bilaterally Heart: regular rate and rhythm, S1, S2 normal, no murmur, click, rub or  gallop Abdomen: soft, non-tender; bowel sounds normal; no masses,  no organomegaly Extremities: extremities normal, atraumatic, no cyanosis or edema Pulses: 2+ and symmetric Skin: Skin color, texture, turgor normal. No rashes or lesions Lymph nodes: Cervical adenopathy: nl and Supraclavicular adenopathy: nl Neurologic: Alert and oriented X 3, normal strength and tone. Normal symmetric reflexes. Normal coordination and gait    Assessment:    Healthy female exam.      Plan:     See After Visit Summary for Counseling Recommendations  Keep up a regular exercise program and make sure you are eating a healthy diet Try to eat 4 servings of dairy a day, or if you are lactose intolerant take a calcium with vitamin D daily.  Declines vaccines today for Tdap and flu.   Hx of gestational DM - A1C i s normal today Gave her reasurrance. Not sure why have dry mouth. No dry eye sxs.   Dry mouth - no DM. Will check thyroid and electrolytes.

## 2013-12-16 NOTE — Patient Instructions (Addendum)
Keep up a regular exercise program and make sure you are eating a healthy diet Try to eat 4 servings of dairy a day, or if you are lactose intolerant take a calcium with vitamin D daily.  Your vaccines are up to date.          Sore or Dry Mouth Care A sore or dry mouth may happen for many different reasons. Sometimes, treatment for other health problems may have to stop until your sore or dry mouth gets better.  HOME CARE  Do not smoke or chew tobacco.  Use fake (artificial) saliva when your mouth feels dry.  Use a humidifier in your bedroom at night.  Eat small meals and snacks.  Eat food cold or at room temperature.  Suck on ice-chips or try frozen ice pops or juice bars, ice-cream, and watermelon. Do not have citrus flavors.  Suck on hard, sugarless, sour candy, or chew sugarless gum to help make more saliva.  Eat soft foods such as yogurt, bananas, canned fruit, mashed potatoes, oatmeal, rice, eggs, cottage cheese, macaroni and cheese, jello, and pudding.  Microwave vegetables and fruits to soften them.  Puree cooked food in a blender if needed.  Make dry food moist by using olive oil, gravy, or mild sauces. Dip foods in liquids.  Keep a glass of water or squirt bottle nearby. Take sips often throughout the day.  Limit caffeine.  Avoid:  Pop or fizzy drinks.  Alcohol.  Citrus juices.  Acidic food.  Salty or spicy food.  Foods or drinks that are very hot.  Hard or crunchy food. Mouth Care  Wash your hands well with soap and water before doing mouth care.  Use fake saliva as told by your doctor.  Use medicine on the sore places.  Brush your teeth at least 2 times a day. Brush after each meal if possible. Rinse your mouth with water after each meal and after drinking a sweet drink.  Brush slowly and gently in small circles. Do not brush side-to-side.  Use regular toothpastes, but stay away from ones that have sodium laurel sulfate in  them.  Gargle with a baking soda mouthwash ( teaspoon baking soda mixed in with 4 cups of water).  Gargle with medicated mouthwash.  Use dental floss or dental tape to clean between your teeth every day.  Use a lanolin-based lip balm to keep your lips from getting dry.  If you wear dentures or bridges:  You may need to leave them out until your doctor tells you to start wearing them again.  Take them out at night if you wear them daily. Soak them in warm water or denture solution. Take your dentures out as much as you can during the day. Take them out when you use mouthwash.  After each meal, brush your gums gently with a soft brush and rinse your mouth with water.  If your dentures rub on your gums and cause a sore spot, have your dentist check and fix your dentures right away. GET HELP RIGHT AWAY IF:   Your mouth gets more painful or dry.  You have questions. MAKE SURE YOU:  Understand these instructions.  Will watch your condition.  Will get help right away if you are not doing well or get worse. Document Released: 01/05/2009 Document Revised: 06/02/2011 Document Reviewed: 01/05/2009 [facility] Patient Information 2015 [place], [place]. This information is not intended to replace advice given to you by your health care provider. Make sure you discuss any  questions you have with your health care provider.  

## 2013-12-16 NOTE — Addendum Note (Signed)
Addended by: Chalmers Cater on: 12/16/2013 04:33 PM   Modules accepted: Orders

## 2013-12-23 LAB — COMPLETE METABOLIC PANEL WITH GFR
ALBUMIN: 4.8 g/dL (ref 3.5–5.2)
ALT: 14 U/L (ref 0–35)
AST: 16 U/L (ref 0–37)
Alkaline Phosphatase: 73 U/L (ref 39–117)
BUN: 13 mg/dL (ref 6–23)
CO2: 25 mEq/L (ref 19–32)
Calcium: 9.3 mg/dL (ref 8.4–10.5)
Chloride: 103 mEq/L (ref 96–112)
Creat: 0.94 mg/dL (ref 0.50–1.10)
GFR, Est African American: 87 mL/min
GFR, Est Non African American: 75 mL/min
Glucose, Bld: 93 mg/dL (ref 70–99)
POTASSIUM: 4.2 meq/L (ref 3.5–5.3)
Sodium: 138 mEq/L (ref 135–145)
Total Bilirubin: 0.5 mg/dL (ref 0.2–1.2)
Total Protein: 7.5 g/dL (ref 6.0–8.3)

## 2013-12-23 LAB — CBC
HCT: 41.4 % (ref 36.0–46.0)
HEMOGLOBIN: 14.5 g/dL (ref 12.0–15.0)
MCH: 30.3 pg (ref 26.0–34.0)
MCHC: 35 g/dL (ref 30.0–36.0)
MCV: 86.6 fL (ref 78.0–100.0)
PLATELETS: 292 10*3/uL (ref 150–400)
RBC: 4.78 MIL/uL (ref 3.87–5.11)
RDW: 13.5 % (ref 11.5–15.5)
WBC: 8.7 10*3/uL (ref 4.0–10.5)

## 2013-12-23 LAB — LIPID PANEL
CHOL/HDL RATIO: 5.1 ratio
Cholesterol: 205 mg/dL — ABNORMAL HIGH (ref 0–200)
HDL: 40 mg/dL (ref >39)
LDL Cholesterol: 125 mg/dL — ABNORMAL HIGH (ref 0–99)
Triglycerides: 200 mg/dL — ABNORMAL HIGH (ref <150)
VLDL: 40 mg/dL (ref 0–40)

## 2013-12-23 LAB — TSH: TSH: 1.683 u[IU]/mL (ref 0.350–4.500)

## 2013-12-26 ENCOUNTER — Telehealth: Payer: Self-pay | Admitting: *Deleted

## 2013-12-26 NOTE — Telephone Encounter (Signed)
error 

## 2015-01-05 ENCOUNTER — Ambulatory Visit (INDEPENDENT_AMBULATORY_CARE_PROVIDER_SITE_OTHER): Payer: BLUE CROSS/BLUE SHIELD | Admitting: Osteopathic Medicine

## 2015-01-05 ENCOUNTER — Encounter: Payer: Self-pay | Admitting: Osteopathic Medicine

## 2015-01-05 VITALS — BP 133/95 | Temp 98.2°F | Wt 178.0 lb

## 2015-01-05 DIAGNOSIS — J019 Acute sinusitis, unspecified: Secondary | ICD-10-CM | POA: Diagnosis not present

## 2015-01-05 DIAGNOSIS — R05 Cough: Secondary | ICD-10-CM

## 2015-01-05 DIAGNOSIS — R059 Cough, unspecified: Secondary | ICD-10-CM

## 2015-01-05 MED ORDER — AMOXICILLIN-POT CLAVULANATE 875-125 MG PO TABS: 1.0000 | ORAL_TABLET | Freq: Two times a day (BID) | ORAL | Status: DC

## 2015-01-05 MED ORDER — GUAIFENESIN-CODEINE 100-10 MG/5ML PO SYRP: 5.0000 mL | ORAL_SOLUTION | Freq: Three times a day (TID) | ORAL | Status: DC | PRN

## 2015-01-05 MED ORDER — IPRATROPIUM BROMIDE 0.03 % NA SOLN: 2.0000 | Freq: Two times a day (BID) | NASAL | Status: DC

## 2015-01-05 MED ORDER — BENZONATATE 200 MG PO CAPS: 200.0000 mg | ORAL_CAPSULE | Freq: Three times a day (TID) | ORAL | Status: DC | PRN

## 2015-01-05 NOTE — Progress Notes (Signed)
HPI: Pamela Stevens is a 44 y.o. female who [facility]  today for chief complaint of:  Chief Complaint  Patient presents with  . Acute Visit    cough & congestion X 5 daysOTC: Nyquil   Cough & congestion . Location: chest, sinuses . Quality: aching, congestion, not getting any better  . Severity: mild to moderate . Duration: 4 days . Assoc signs/symptoms: fever/chills and body aches, some sinus pressure and drainage, but cough is most bothersome   Past medical, social and family history reviewed: Past Medical History  Diagnosis Date  . DIABETES MELLITUS, GESTATIONAL, INSULIN-DEPENDENT 03/05/2007    Qualifier: Diagnosis of  By: Thomos LemonsBowen DO, Karen    . PCOS (polycystic ovarian syndrome)    Past Surgical History  Procedure Laterality Date  . No past surgeries     Social History  Substance Use Topics  . Smoking status: Never Smoker   . Smokeless tobacco: Not on file  . Alcohol Use: 0.0 - 0.5 oz/week    0-1 drink(s) per week   No family history on file.  Current Outpatient Prescriptions  Medication Sig Dispense Refill  . Multiple Vitamins-Minerals (MULTIVITAMIN WITH MINERALS) tablet Take 1 tablet by mouth daily.    . vitamin E 200 UNIT capsule Take 200 Units by mouth daily.     No current facility-administered medications for this visit.   No Known Allergies    Review of Systems: CONSTITUTIONAL: Neg fever/chills, no unintentional weight changes HEAD/EYES/EARS/NOSE/THROAT: No headache/vision change or hearing change, mild sore throat, (+) sinus congestion CARDIAC: No chest pain/pressure/palpitations, no orthopnea RESPIRATORY: No cough/shortness of breath/wheeze GASTROINTESTINAL: No nausea/vomiting MUSCULOSKELETAL: (+) aches/myalgia/arthralgia    Exam:  There were no vitals taken for this visit. Constitutional: VSS, see above. General Appearance: alert, well-developed, well-nourished, NAD Eyes: Normal lids and  conjunctive, non-icteric sclera, PERRLA Ears, Nose, Mouth, Throat: Normal external inspection ears/nares/mouth/lips/gums, Normal TM bilaterally, MMM, posterior pharynx without erythema/exudate, (+) sinus tenderness bilateral Neck: No masses, trachea midline. No thyroid enlargement/tenderness/mass appreciated Respiratory: Normal respiratory effort. no wheeze/rhonchi/rales Cardiovascular: S1/S2 normal, no murmur/rub/gallop auscultated. RRR.  No results found for this or any previous visit (from the past 72 hour(s)).    ASSESSMENT/PLAN:  Acute rhinosinusitis - Plan: amoxicillin-clavulanate (AUGMENTIN) 875-125 MG tablet, ipratropium (ATROVENT) 0.03 % nasal spray  Cough - Plan: guaiFENesin-codeine (ROBITUSSIN AC) 100-10 MG/5ML syrup, benzonatate (TESSALON) 200 MG capsule    Rx printed for abx to fill if no better, supportive care advised, see pt instructions.

## 2015-01-05 NOTE — Patient Instructions (Signed)
Fill antibiotics if you are note feeling better over the weekend.  Use nasal spray to decrease inflammation in sinuses and help drainage - this will help the cough and the ear pressure. Can also take antihistamine and/or decongestant pills over the counter.  Cough syrup at night, benzonatate pills during the day for coughing.  If no better in next 1 - 2 weeks, come back to clinic.

## 2015-01-12 ENCOUNTER — Telehealth: Payer: Self-pay | Admitting: Family Medicine

## 2015-01-12 MED ORDER — AZITHROMYCIN 250 MG PO TABS: ORAL_TABLET | ORAL | Status: DC

## 2015-01-12 NOTE — Telephone Encounter (Signed)
Call pt:  i will send in script for zpack. If not better by Monday then needs OV.

## 2015-01-12 NOTE — Telephone Encounter (Signed)
Pt advised of Rx and states she will start taking it. Pt already has an appt scheduled for Monday. No further questions.

## 2015-01-12 NOTE — Telephone Encounter (Signed)
Patient was treated by Dr. Lyn HollingsheadAlexander for a sinus infection on 01/05/15. Pt states she has been taking her antibiotic (last day today) her congestion is getting better, but her cough is getting worse. Pt states it is hard for her to sleep at night, even propped up due to the coughing. Will route to PCP for review and recommendation.

## 2015-01-15 ENCOUNTER — Ambulatory Visit (INDEPENDENT_AMBULATORY_CARE_PROVIDER_SITE_OTHER): Payer: BLUE CROSS/BLUE SHIELD | Admitting: Family Medicine

## 2015-01-15 ENCOUNTER — Encounter: Payer: Self-pay | Admitting: Family Medicine

## 2015-01-15 VITALS — BP 127/86 | HR 88 | Temp 98.6°F | Resp 18 | Ht 66.0 in | Wt 176.8 lb

## 2015-01-15 DIAGNOSIS — Z0189 Encounter for other specified special examinations: Secondary | ICD-10-CM | POA: Diagnosis not present

## 2015-01-15 DIAGNOSIS — Z0000 Encounter for general adult medical examination without abnormal findings: Secondary | ICD-10-CM

## 2015-01-15 NOTE — Progress Notes (Signed)
   Subjective:    Patient ID: Pamela Stevens, female    DOB: December 08, 1970, 44 y.o.   MRN: [id number]  HPI    Review of Systems     Objective:   Physical Exam        Assessment & Plan:    Subjective:     Pamela Stevens is a 44 y.o. female and is here for a comprehensive physical exam. The patient reports no problems. She sees Mare LoanLaurie Cox at [facility] for her female exam.  Social History   Social History  . Marital Status: Married    Spouse Name: N/A  . Number of Children: 2  . Years of Education: N/A   Occupational History  . Runs consumer services program    Social History Main Topics  . Smoking status: Never Smoker   . Smokeless tobacco: Not on file  . Alcohol Use: 0.0 - 0.5 oz/week    0-1 drink(s) per week  . Drug Use: Not on file  . Sexual Activity:    Partners: Male   Other Topics Concern  . Not on file   Social History Narrative   Some exercising. 1-2 caffeine drinks per day.    Health Maintenance  Topic Date Due  . HIV Screening  08/13/1986  . PAP SMEAR  04/15/2014  . INFLUENZA VACCINE  10/23/2014  . TETANUS/TDAP  05/15/2021    The following portions of the patient's history were reviewed and updated as appropriate: allergies, current medications, past family history, past medical history, past social history, past surgical history and problem list.  Review of Systems Pertinent items noted in HPI and remainder of comprehensive ROS otherwise negative.   Objective:    BP 127/86 mmHg  Pulse 88  Temp(Src) 98.6 F (37 C) (Oral)  Resp 18  Ht 5\' 6"  (1.676 m)  Wt 176 lb 12.8 oz (80.196 kg)  BMI 28.55 kg/m2  SpO2 99%  LMP 12/04/2014 General appearance: alert, cooperative and appears stated age Head: Normocephalic, without obvious abnormality, atraumatic Eyes: conj clear, EOMI, PEERLA Ears: normal TM's and external ear canals both ears Nose: Nares normal. Septum midline. Mucosa normal. No drainage or sinus  tenderness. Throat: lips, mucosa, and tongue normal; teeth and gums normal Neck: no adenopathy, no carotid bruit, no JVD, supple, symmetrical, trachea midline and thyroid not enlarged, symmetric, no tenderness/mass/nodules Back: symmetric, no curvature. ROM normal. No CVA tenderness. Lungs: clear to auscultation bilaterally Heart: regular rate and rhythm, S1, S2 normal, no murmur, click, rub or gallop Abdomen: soft, non-tender; bowel sounds normal; no masses,  no organomegaly Extremities: extremities normal, atraumatic, no cyanosis or edema Pulses: 2+ and symmetric Skin: Skin color, texture, turgor normal. No rashes or lesions Lymph nodes: Cervical, supraclavicular, and axillary nodes normal. Neurologic: Alert and oriented X 3, normal strength and tone. Normal symmetric reflexes. Normal coordination and gait    Assessment:    Healthy female exam.     Plan:     See After Visit Summary for Counseling Recommendations  Keep up a regular exercise program and make sure you are eating a healthy diet Try to eat 4 servings of dairy a day, or if you are lactose intolerant take a calcium with vitamin D daily.  Your vaccines are up to date.

## 2015-01-15 NOTE — Patient Instructions (Signed)
Keep up a regular exercise program and make sure you are eating a healthy diet Try to eat 4 servings of dairy a day, or if you are lactose intolerant take a calcium with vitamin D daily.  Your vaccines are up to date.   

## 2015-03-12 ENCOUNTER — Ambulatory Visit (INDEPENDENT_AMBULATORY_CARE_PROVIDER_SITE_OTHER): Payer: BLUE CROSS/BLUE SHIELD

## 2015-03-12 ENCOUNTER — Ambulatory Visit (INDEPENDENT_AMBULATORY_CARE_PROVIDER_SITE_OTHER): Payer: BLUE CROSS/BLUE SHIELD | Admitting: Sports Medicine

## 2015-03-12 ENCOUNTER — Encounter: Payer: Self-pay | Admitting: Sports Medicine

## 2015-03-12 VITALS — BP 123/81 | HR 85 | Temp 97.9°F | Resp 18 | Wt 178.6 lb

## 2015-03-12 DIAGNOSIS — M25542 Pain in joints of left hand: Secondary | ICD-10-CM

## 2015-03-12 DIAGNOSIS — M778 Other enthesopathies, not elsewhere classified: Secondary | ICD-10-CM | POA: Insufficient documentation

## 2015-03-12 DIAGNOSIS — M6588 Other synovitis and tenosynovitis, other site: Secondary | ICD-10-CM | POA: Diagnosis not present

## 2015-03-12 DIAGNOSIS — M779 Enthesopathy, unspecified: Principal | ICD-10-CM

## 2015-03-12 MED ORDER — MELOXICAM 15 MG PO TABS: ORAL_TABLET | ORAL | Status: DC

## 2015-03-12 MED ORDER — PREDNISONE 50 MG PO TABS: ORAL_TABLET | ORAL | Status: DC

## 2015-03-12 NOTE — Assessment & Plan Note (Signed)
Prednisone for 5 days, x-rays. This likely represents a de Quervain's tendinitis, rehabilitation exercises given, return in one month, if no better we will consider an injection.

## 2015-03-12 NOTE — Progress Notes (Signed)
   Subjective:    I'm seeing this patient as a consultation for:  Dr. Nani Gasseratherine Metheney  CC: Left hand pain  HPI: For the past several days this pleasant 44 year old female has had increasing swelling over the dorsum and radial aspect of her left hand and wrist, pain radiates along the distribution of the first extremity compartment. Moderate, improving. No known trauma, no known overuse injury.  Past medical history, Surgical history, Family history not pertinant except as noted below, Social history, Allergies, and medications have been entered into the medical record, reviewed, and no changes needed.   Review of Systems: No headache, visual changes, nausea, vomiting, diarrhea, constipation, dizziness, abdominal pain, skin rash, fevers, chills, night sweats, weight loss, swollen lymph nodes, body aches, joint swelling, muscle aches, chest pain, shortness of breath, mood changes, visual or auditory hallucinations.   Objective:   General: Well Developed, well nourished, and in no acute distress.  Neuro/Psych: Alert and oriented x3, extra-ocular muscles intact, able to move all 4 extremities, sensation grossly intact. Skin: Warm and dry, no rashes noted.  Respiratory: Not using accessory muscles, speaking in full sentences, trachea midline.  Cardiovascular: Pulses palpable, no extremity edema. Abdomen: Does not appear distended. Left Wrist: Hand is visibly swollen, there is tenderness at the interossei between the second and third digits, she also has tenderness over the first accessory compartment with a positive Finkelstein sign. ROM smooth and normal with good flexion and extension and ulnar/radial deviation that is symmetrical with opposite wrist. Palpation is normal over metacarpals, navicular, lunate, and TFCC; tendons without tenderness/ swelling No snuffbox tenderness. No tenderness over Canal of Guyon. Strength 5/5 in all directions without pain. Negative tinel's and  phalens. Negative Watson's test.  Impression and Recommendations:   This case required medical decision making of moderate complexity.

## 2015-04-09 ENCOUNTER — Ambulatory Visit: Payer: Self-pay | Admitting: Sports Medicine

## 2015-07-27 DIAGNOSIS — L821 Other seborrheic keratosis: Secondary | ICD-10-CM | POA: Diagnosis not present

## 2015-07-27 DIAGNOSIS — D225 Melanocytic nevi of trunk: Secondary | ICD-10-CM | POA: Diagnosis not present

## 2015-10-17 DIAGNOSIS — Z01419 Encounter for gynecological examination (general) (routine) without abnormal findings: Secondary | ICD-10-CM | POA: Diagnosis not present

## 2015-10-17 DIAGNOSIS — N390 Urinary tract infection, site not specified: Secondary | ICD-10-CM | POA: Diagnosis not present

## 2015-10-17 DIAGNOSIS — Z3202 Encounter for pregnancy test, result negative: Secondary | ICD-10-CM | POA: Diagnosis not present

## 2015-10-17 DIAGNOSIS — N938 Other specified abnormal uterine and vaginal bleeding: Secondary | ICD-10-CM | POA: Diagnosis not present

## 2015-10-17 DIAGNOSIS — Z01411 Encounter for gynecological examination (general) (routine) with abnormal findings: Secondary | ICD-10-CM | POA: Diagnosis not present

## 2015-10-17 DIAGNOSIS — Z6829 Body mass index (BMI) 29.0-29.9, adult: Secondary | ICD-10-CM | POA: Diagnosis not present

## 2015-10-17 LAB — HM PAP SMEAR: HM Pap smear: NEGATIVE

## 2015-10-25 ENCOUNTER — Encounter: Payer: Self-pay | Admitting: Family Medicine

## 2016-01-30 ENCOUNTER — Encounter: Payer: Self-pay | Admitting: Osteopathic Medicine

## 2016-01-30 ENCOUNTER — Ambulatory Visit (INDEPENDENT_AMBULATORY_CARE_PROVIDER_SITE_OTHER): Payer: BLUE CROSS/BLUE SHIELD | Admitting: Osteopathic Medicine

## 2016-01-30 VITALS — BP 154/98 | HR 96 | Ht 66.0 in | Wt 181.0 lb

## 2016-01-30 DIAGNOSIS — R309 Painful micturition, unspecified: Secondary | ICD-10-CM | POA: Diagnosis not present

## 2016-01-30 DIAGNOSIS — J019 Acute sinusitis, unspecified: Secondary | ICD-10-CM

## 2016-01-30 DIAGNOSIS — N3090 Cystitis, unspecified without hematuria: Secondary | ICD-10-CM | POA: Diagnosis not present

## 2016-01-30 DIAGNOSIS — H6982 Other specified disorders of Eustachian tube, left ear: Secondary | ICD-10-CM | POA: Diagnosis not present

## 2016-01-30 DIAGNOSIS — R030 Elevated blood-pressure reading, without diagnosis of hypertension: Secondary | ICD-10-CM | POA: Insufficient documentation

## 2016-01-30 DIAGNOSIS — L700 Acne vulgaris: Secondary | ICD-10-CM | POA: Insufficient documentation

## 2016-01-30 LAB — POCT URINALYSIS DIPSTICK
Bilirubin, UA: NEGATIVE
Glucose, UA: NEGATIVE
Ketones, UA: NEGATIVE
Nitrite, UA: NEGATIVE
SPEC GRAV UA: 1.02
UROBILINOGEN UA: 0.2
pH, UA: 7.5

## 2016-01-30 MED ORDER — IPRATROPIUM BROMIDE 0.03 % NA SOLN: 2.0000 | Freq: Three times a day (TID) | NASAL | 0 refills | Status: DC

## 2016-01-30 MED ORDER — SULFAMETHOXAZOLE-TRIMETHOPRIM 800-160 MG PO TABS: 1.0000 | ORAL_TABLET | Freq: Two times a day (BID) | ORAL | 0 refills | Status: DC

## 2016-01-30 NOTE — Patient Instructions (Addendum)
For UTI: We should have urine culture results back by Monday and will give you a call if we need to change the antibiotics based on these results. If you develop severe abdominal pain, fever, nausea, please let us know as these are signs of a more serious infection  For eustachian tube dysfunction: Prescription for nasal spray was given. Over-the-counter antihistamine such as Claritin, Zyrtec, Allegra or other generics are advised. If no improvement by Monday, let us know and we can refer to ENT  For annual physical: Please schedule with PCP. We can send her message and see if she is okay to get labs done ahead of time.

## 2016-01-30 NOTE — Progress Notes (Signed)
HPI: Pamela Stevens is a 45 y.o. female  who presents to [facility] today, 01/30/16,  for chief complaint of:  Chief Complaint  Patient presents with  . Urinary Tract Infection  . Ear Fullness  . Acne    UTI: Acute, 3-4 days ago. Experiencing burning with urination and some lower pelvic cramping like she is about to start her period, no unusual vaginal leading or discharge. Husband has vasectomy.  Ear fullness: On left ear, associated with some sinus congestion/drainage but she states her pain actually started first. Has been ongoing for about 2 weeks. Associated with dizziness symptoms x1  Acne: Concern for possible hormonal cause. States topical therapy has not helped in the past.   Past medical, surgical, social and family history reviewed: Patient Active Problem List   Diagnosis Date Noted  . Tendinitis of left hand 03/12/2015  . Postconcussion syndrome 10/13/2012  . Degenerative disc disease, cervical 10/04/2012  . ULNAR NEUROPATHY 06/03/2010  . CARPAL TUNNEL SYNDROME, LEFT 05/20/2010  . DELAYED MENSES 06/17/2007  . ALLERGIC RHINITIS, SEASONAL 05/03/2007  . DIABETES MELLITUS, GESTATIONAL, INSULIN-DEPENDENT 03/05/2007   Past Surgical History:  Procedure Laterality Date  . NO PAST SURGERIES     Social History  Substance Use Topics  . Smoking status: Never Smoker  . Smokeless tobacco: Not on file  . Alcohol use 0.0 - 0.5 oz/week    0 - 1 drink(s) per week   Family History  Problem Relation Age of Onset  . Diabetes Mellitus II Mother   . Diabetes Mellitus II Father   . Hypertension Mother   . Hypercholesterolemia Father      Current medication list and allergy/intolerance information reviewed:   Current Outpatient Prescriptions on File Prior to Visit  Medication Sig Dispense Refill  . BIOTIN PO Take by mouth.    Marland Kitchen. ipratropium (ATROVENT) 0.03 % nasal spray Place 2 sprays into both nostrils every 12 (twelve) hours. 30 mL 1   . Multiple Vitamins-Minerals (MULTIVITAMIN WITH MINERALS) tablet Take 1 tablet by mouth daily.    . vitamin E 200 UNIT capsule Take 200 Units by mouth daily.     No current facility-administered medications on file prior to visit.    No Known Allergies    Review of Systems:  Constitutional: No recent illness  HEENT: No  headache, no vision change, ear complaints and sinus congestion as noted per history of present illness  Cardiac: No  chest pain, No  pressure, No palpitations  Respiratory:  No  shortness of breath. No  Cough  Gastrointestinal: No  abdominal pain, no change on bowel habits  GU: UTI sx as per HPI  Neurologic: No  weakness, +Dizziness one episode  Skin: Acne as per history of present illness   Exam:  BP (!) 154/98   Pulse 96   Ht 5\' 6"  (1.676 m)   Wt 181 lb (82.1 kg)   BMI 29.21 kg/m   Constitutional: VS see above. General Appearance: alert, well-developed, well-nourished, NAD  Eyes: Normal lids and conjunctive, non-icteric sclera  Ears, Nose, Mouth, Throat: MMM, Normal external inspection ears/nares/mouth/lips/gums. Tympanic memories on left positive for effusion, no redness/bulging. Nasal mucosa normal. Adenopathy  Neck: No masses, trachea midline.   Respiratory: Normal respiratory effort. no wheeze, no rhonchi, no rales  Cardiovascular: S1/S2 normal, no murmur, no rub/gallop auscultated. RRR.   Musculoskeletal: Gait normal. Symmetric and independent movement of all extremities. Lloyd sign negative bilaterally  Neurological: Normal balance/coordination. No tremor.  Skin:  warm, dry, intact. Facial acne, mild  Psychiatric: Normal judgment/insight. Normal mood and affect. Oriented x3.    Results for orders placed or performed in visit on 01/30/16 (from the past 72 hour(s))  POCT Urinalysis Dipstick     Status: Abnormal   Collection Time: 01/30/16  3:58 PM  Result Value Ref Range   Color, UA YELLOW    Clarity, UA CLOUDY    Glucose, UA  NEGATIVE    Bilirubin, UA NEGATIVE    Ketones, UA NEGATIVE    Spec Grav, UA 1.020    Blood, UA MODERATE    pH, UA 7.5    Protein, UA >300    Urobilinogen, UA 0.2    Nitrite, UA NEGATIVE    Leukocytes, UA large (3+) (A) Negative    No results found.   ASSESSMENT/PLAN:   Cystitis - Plan: POCT Urinalysis Dipstick, Urine Culture  Painful urination - Plan: POCT Urinalysis Dipstick  Dysfunction of left eustachian tube  Acute rhinosinusitis - Plan: ipratropium (ATROVENT) 0.03 % nasal spray  Elevated blood pressure reading - Previous readings normal, follow-up with PCP  Acne vulgaris - Chronic medical issue, advised follow-up with PCP    Patient Instructions  For UTI: We should have urine culture results back by Monday and will give you a call if we need to change the antibiotics based on these results. If you develop severe abdominal pain, fever, nausea, please let us know as these are signs of a more serious infection  For eustachian tube dysfunction: Prescription for nasal spray was given. Over-the-counter antihistamine such as Claritin, Zyrtec, Allegra or other generics are advised. If no improvement by Monday, let us know and we can refer to ENT  For annual physical: Please schedule with PCP. We can send her message and see if she is okay to get labs done ahead of time.     Visit summary with medication list and pertinent instructions was printed for patient to review. All questions at time of visit were answered - patient instructed to contact office with any additional concerns. ER/RTC precautions were reviewed with the patient. Follow-up plan: Return for Annual physical with PCP, address acne/hormonal concerns .

## 2016-02-01 LAB — URINE CULTURE

## 2016-02-21 ENCOUNTER — Encounter: Payer: Self-pay | Admitting: Family Medicine

## 2016-07-28 DIAGNOSIS — N938 Other specified abnormal uterine and vaginal bleeding: Secondary | ICD-10-CM | POA: Diagnosis not present

## 2016-11-18 DIAGNOSIS — Z1231 Encounter for screening mammogram for malignant neoplasm of breast: Secondary | ICD-10-CM | POA: Diagnosis not present

## 2016-11-20 LAB — HM MAMMOGRAPHY

## 2017-03-09 ENCOUNTER — Telehealth: Payer: Self-pay | Admitting: Family Medicine

## 2017-03-09 DIAGNOSIS — Z13220 Encounter for screening for lipoid disorders: Secondary | ICD-10-CM

## 2017-03-09 DIAGNOSIS — R739 Hyperglycemia, unspecified: Secondary | ICD-10-CM

## 2017-03-09 DIAGNOSIS — E7800 Pure hypercholesterolemia, unspecified: Secondary | ICD-10-CM

## 2017-03-09 DIAGNOSIS — R030 Elevated blood-pressure reading, without diagnosis of hypertension: Secondary | ICD-10-CM

## 2017-03-09 NOTE — Telephone Encounter (Signed)
Pt called to schedule a CPE with you on Jan. 14th and wants to know if you can go ahead and order the blood work for her so she can have it done before her appointment. She would also like her A1c checked and hormone panel that checks insulin and leptin. Thanks

## 2017-03-10 NOTE — Telephone Encounter (Signed)
OK for CMP, lipid, A1C, insulin and leptin

## 2017-03-12 NOTE — Addendum Note (Signed)
Addended by: Chalmers CaterUTTLE, Sharri Loya H on: 03/12/2017 10:41 AM   Modules accepted: Orders

## 2017-03-12 NOTE — Telephone Encounter (Signed)
Labs ordered.

## 2017-03-20 ENCOUNTER — Telehealth: Payer: Self-pay | Admitting: Family Medicine

## 2017-03-20 NOTE — Telephone Encounter (Signed)
Original orders were already placed on December 20.  Please add TSH, free T4 and free T3 per her request..  She can go at her convenience.

## 2017-03-20 NOTE — Telephone Encounter (Signed)
Pt called. She has an appointment on 1/14 and would like lab orders sent down on 1/4. She wants to include full thyroid panel and A1c test.

## 2017-03-23 NOTE — Telephone Encounter (Signed)
Thank you :)

## 2017-03-23 NOTE — Telephone Encounter (Signed)
Left VM advising Pt.  

## 2017-03-30 DIAGNOSIS — R739 Hyperglycemia, unspecified: Secondary | ICD-10-CM | POA: Diagnosis not present

## 2017-03-30 DIAGNOSIS — R5383 Other fatigue: Secondary | ICD-10-CM | POA: Diagnosis not present

## 2017-03-30 DIAGNOSIS — E7800 Pure hypercholesterolemia, unspecified: Secondary | ICD-10-CM | POA: Diagnosis not present

## 2017-03-30 DIAGNOSIS — R030 Elevated blood-pressure reading, without diagnosis of hypertension: Secondary | ICD-10-CM | POA: Diagnosis not present

## 2017-04-04 LAB — COMPLETE METABOLIC PANEL WITH GFR
AG Ratio: 1.6 (calc) (ref 1.0–2.5)
ALKALINE PHOSPHATASE (APISO): 75 U/L (ref 33–115)
ALT: 13 U/L (ref 6–29)
AST: 14 U/L (ref 10–35)
Albumin: 4.5 g/dL (ref 3.6–5.1)
BILIRUBIN TOTAL: 0.5 mg/dL (ref 0.2–1.2)
BUN: 15 mg/dL (ref 7–25)
CHLORIDE: 102 mmol/L (ref 98–110)
CO2: 28 mmol/L (ref 20–32)
CREATININE: 1.06 mg/dL (ref 0.50–1.10)
Calcium: 9.5 mg/dL (ref 8.6–10.2)
GFR, Est African American: 73 mL/min/{1.73_m2} (ref >=60)
GFR, Est Non African American: 63 mL/min/{1.73_m2} (ref >=60)
GLUCOSE: 95 mg/dL (ref 65–99)
Globulin: 2.9 g/dL (calc) (ref 1.9–3.7)
Potassium: 4.3 mmol/L (ref 3.5–5.3)
Sodium: 138 mmol/L (ref 135–146)
Total Protein: 7.4 g/dL (ref 6.1–8.1)

## 2017-04-04 LAB — T3, FREE: T3 FREE: 3 pg/mL (ref 2.3–4.2)

## 2017-04-04 LAB — TSH: TSH: 2.22 mIU/L

## 2017-04-04 LAB — LIPID PANEL W/REFLEX DIRECT LDL
CHOL/HDL RATIO: 5.8 (calc) — AB (ref <5.0)
CHOLESTEROL: 186 mg/dL (ref <200)
HDL: 32 mg/dL — AB (ref >50)
LDL CHOLESTEROL (CALC): 119 mg/dL — AB
Non-HDL Cholesterol (Calc): 154 mg/dL (calc) — ABNORMAL HIGH (ref <130)
TRIGLYCERIDES: 228 mg/dL — AB (ref <150)

## 2017-04-04 LAB — HEMOGLOBIN A1C
Hgb A1c MFr Bld: 5.8 % of total Hgb — ABNORMAL HIGH (ref <5.7)
Mean Plasma Glucose: 120 (calc)
eAG (mmol/L): 6.6 (calc)

## 2017-04-04 LAB — LEPTIN: LEPTIN, SERUM: 7.7 ng/mL

## 2017-04-04 LAB — T4, FREE: Free T4: 1.1 ng/dL (ref 0.8–1.8)

## 2017-04-06 ENCOUNTER — Ambulatory Visit (INDEPENDENT_AMBULATORY_CARE_PROVIDER_SITE_OTHER): Payer: BLUE CROSS/BLUE SHIELD | Admitting: Family Medicine

## 2017-04-06 ENCOUNTER — Encounter: Payer: Self-pay | Admitting: Family Medicine

## 2017-04-06 VITALS — BP 137/84 | HR 84 | Ht 66.0 in | Wt 177.0 lb

## 2017-04-06 DIAGNOSIS — J0110 Acute frontal sinusitis, unspecified: Secondary | ICD-10-CM | POA: Diagnosis not present

## 2017-04-06 DIAGNOSIS — R7301 Impaired fasting glucose: Secondary | ICD-10-CM | POA: Diagnosis not present

## 2017-04-06 DIAGNOSIS — Z0000 Encounter for general adult medical examination without abnormal findings: Secondary | ICD-10-CM

## 2017-04-06 MED ORDER — AMOXICILLIN-POT CLAVULANATE 875-125 MG PO TABS: 1.0000 | ORAL_TABLET | Freq: Two times a day (BID) | ORAL | 0 refills | Status: DC

## 2017-04-06 NOTE — Patient Instructions (Signed)
Preventive Care 18-39 Years, Female Preventive care refers to lifestyle choices and visits with your health care provider that can promote health and wellness. What does preventive care include?  A yearly physical exam. This is also called an annual well check.  Dental exams once or twice a year.  Routine eye exams. Ask your health care provider how often you should have your eyes checked.  Personal lifestyle choices, including: ? Daily care of your teeth and gums. ? Regular physical activity. ? Eating a healthy diet. ? Avoiding tobacco and drug use. ? Limiting alcohol use. ? Practicing safe sex. ? Taking vitamin and mineral supplements as recommended by your health care provider. What happens during an annual well check? The services and screenings done by your health care provider during your annual well check will depend on your age, overall health, lifestyle risk factors, and family history of disease. Counseling Your health care provider may ask you questions about your:  Alcohol use.  Tobacco use.  Drug use.  Emotional well-being.  Home and relationship well-being.  Sexual activity.  Eating habits.  Work and work Statistician.  Method of birth control.  Menstrual cycle.  Pregnancy history.  Screening You may have the following tests or measurements:  Height, weight, and BMI.  Diabetes screening. This is done by checking your blood sugar (glucose) after you have not eaten for a while (fasting).  Blood pressure.  Lipid and cholesterol levels. These may be checked every 5 years starting at age 38.  Skin check.  Hepatitis C blood test.  Hepatitis B blood test.  Sexually transmitted disease (STD) testing.  BRCA-related cancer screening. This may be done if you have a family history of breast, ovarian, tubal, or peritoneal cancers.  Pelvic exam and Pap test. This may be done every 3 years starting at age 38. Starting at age 30, this may be done  every 5 years if you have a Pap test in combination with an HPV test.  Discuss your test results, treatment options, and if necessary, the need for more tests with your health care provider. Vaccines Your health care provider may recommend certain vaccines, such as:  Influenza vaccine. This is recommended every year.  Tetanus, diphtheria, and acellular pertussis (Tdap, Td) vaccine. You may need a Td booster every 10 years.  Varicella vaccine. You may need this if you have not been vaccinated.  HPV vaccine. If you are 39 or younger, you may need three doses over 6 months.  Measles, mumps, and rubella (MMR) vaccine. You may need at least one dose of MMR. You may also need a second dose.  Pneumococcal 13-valent conjugate (PCV13) vaccine. You may need this if you have certain conditions and were not previously vaccinated.  Pneumococcal polysaccharide (PPSV23) vaccine. You may need one or two doses if you smoke cigarettes or if you have certain conditions.  Meningococcal vaccine. One dose is recommended if you are age 68-21 years and a first-year college student living in a residence hall, or if you have one of several medical conditions. You may also need additional booster doses.  Hepatitis A vaccine. You may need this if you have certain conditions or if you travel or work in places where you may be exposed to hepatitis A.  Hepatitis B vaccine. You may need this if you have certain conditions or if you travel or work in places where you may be exposed to hepatitis B.  Haemophilus influenzae type b (Hib) vaccine. You may need this  if you have certain risk factors.  Talk to your health care provider about which screenings and vaccines you need and how often you need them. This information is not intended to replace advice given to you by your health care provider. Make sure you discuss any questions you have with your health care provider. Document Released: 05/06/2001 Document Revised:  11/28/2015 Document Reviewed: 01/09/2015 Elsevier Interactive Patient Education  2018 Elsevier Inc.  

## 2017-04-06 NOTE — Progress Notes (Signed)
Subjective:     Pamela Stevens is a 47 y.o. female and is here for a comprehensive physical exam. The patient reports no problems. Lauri cox in her GYN and she gets her mammo's done at [place].  Will call for report.    Sinus congestion x 3 weeks. No fever, chills or sweats.  Using Sinex nasal spray.  No ST.  Post nasal drip and runny nose as well. Frontal HA and some teeth pain.  No worsening or alleviating sx.    Had labs done. Wants to discuss results.    Social History   Socioeconomic History  . Marital status: Married    Spouse name: Not on file  . Number of children: 2  . Years of education: Not on file  . Highest education level: Not on file  Social Needs  . Financial resource strain: Not on file  . Food insecurity - worry: Not on file  . Food insecurity - inability: Not on file  . Transportation needs - medical: Not on file  . Transportation needs - non-medical: Not on file  Occupational History  . Occupation: Control and instrumentation engineer  Tobacco Use  . Smoking status: Never Smoker  Substance and Sexual Activity  . Alcohol use: Yes    Alcohol/week: 0.0 - 0.5 oz  . Drug use: Not on file  . Sexual activity: Yes    Partners: Male  Other Topics Concern  . Not on file  Social History Narrative   Some exercising. 1-2 caffeine drinks per day.    Health Maintenance  Topic Date Due  . HIV Screening  08/13/1986  . INFLUENZA VACCINE  01/21/2018 (Originally 10/22/2016)  . PAP SMEAR  10/17/2018  . TETANUS/TDAP  05/15/2021    The following portions of the patient's history were reviewed and updated as appropriate: allergies, current medications, past family history, past medical history, past social history, past surgical history and problem list.  Review of Systems A comprehensive review of systems was negative.   Objective:    BP 137/84   Pulse 84   Ht 5\' 6"  (1.676 m)   Wt 177 lb (80.3 kg)   SpO2 99%   BMI 28.57 kg/m  General appearance: alert,  cooperative and appears stated age Head: Normocephalic, without obvious abnormality, atraumatic Eyes: negative findings: lids and lashes normal,   Ears: normal TM's and external ear canals both ears Nose: Nares normal. Septum midline. Mucosa normal. No drainage or sinus tenderness.  Mildly swollen turbinates. Throat: lips, mucosa, and tongue normal; teeth and gums normal Neck: no adenopathy, no carotid bruit, no JVD, supple, symmetrical, trachea midline and thyroid not enlarged, symmetric, no tenderness/mass/nodules Back: symmetric, no curvature. ROM normal. No CVA tenderness. Lungs: clear to auscultation bilaterally Heart: regular rate and rhythm, S1, S2 normal, no murmur, click, rub or gallop Abdomen: soft, non-tender; bowel sounds normal; no masses,  no organomegaly Extremities: extremities normal, atraumatic, no cyanosis or edema Pulses: 2+ and symmetric Skin: Skin color, texture, turgor normal. No rashes or lesions Lymph nodes: Cervical adenopathy: nl and Supraclavicular adenopathy: nl Neurologic: Alert and oriented X 3, normal strength and tone. Normal symmetric reflexes. Normal coordination and gait    Assessment:    Healthy female exam.      Plan:     See After Visit Summary for Counseling Recommendations   Keep up a regular exercise program and make sure you are eating a healthy diet Try to eat 4 servings of dairy a day, or if you are  lactose intolerant take a calcium with vitamin D daily.  Your vaccines are up to date.   IFG - new dx. Discussed working on diet and exercise.  Will check A1C.    Acute sinusitis - willl tx with augmentin.  Okay to continue over-the-counter symptomatic care.  If not improving in the next week consider adding a nasal steroid spray.

## 2017-04-10 ENCOUNTER — Ambulatory Visit (INDEPENDENT_AMBULATORY_CARE_PROVIDER_SITE_OTHER): Payer: BLUE CROSS/BLUE SHIELD | Admitting: Family Medicine

## 2017-04-10 ENCOUNTER — Encounter: Payer: Self-pay | Admitting: Family Medicine

## 2017-04-10 VITALS — BP 140/90 | HR 88 | Temp 98.5°F

## 2017-04-10 DIAGNOSIS — Z23 Encounter for immunization: Secondary | ICD-10-CM | POA: Diagnosis not present

## 2017-04-10 DIAGNOSIS — S61451A Open bite of right hand, initial encounter: Secondary | ICD-10-CM | POA: Diagnosis not present

## 2017-04-10 DIAGNOSIS — W5501XA Bitten by cat, initial encounter: Secondary | ICD-10-CM

## 2017-04-10 MED ORDER — AMOXICILLIN-POT CLAVULANATE 875-125 MG PO TABS: 1.0000 | ORAL_TABLET | Freq: Two times a day (BID) | ORAL | 0 refills | Status: DC

## 2017-04-10 NOTE — Progress Notes (Signed)
   Subjective:    Patient ID: Pamela Stevens, female    DOB: 07-21-1970, 47 y.o.   MRN: [id number]  HPI [age] year old female comes in today after experiencing a cat bite.  Cat evidently got into her building at work.  She was trying to help rescue the cat and had a held in a blanket.  It scratched her left forearm but then actually bit her first finger on her right hand.  There are 3 puncture wound marks.  She says her hand feels a little achy but she was also trying to hold onto the Fairly firmly.  She has not had any redness fevers or chills.  She is actually Artie on Augmentin for sinus infection.  She says her sinuses do feel a little bit better.   Review of Systems     Objective:   Physical Exam  Constitutional: She is oriented to person, place, and time. She appears well-developed and well-nourished.  HENT:  Head: Normocephalic and atraumatic.  Eyes: Conjunctivae and EOM are normal.  Cardiovascular: Normal rate.  Pulmonary/Chest: Effort normal.  Neurological: She is alert and oriented to person, place, and time.  Skin: Skin is dry. No pallor.  Several fairly superficial scratches on the left arm just a little scabbing over the scratch.  No drainage.  On her first finger on her right hand she has 3 small puncture wounds.  No abscess or drainage.  No induration.  No streaking erythema.  And no heat or increased warmth to the hands or joints.  She is able to move her hands and fingers normally.  Psychiatric: She has a normal mood and affect. Her behavior is normal.  Vitals reviewed.         Assessment & Plan:  Cat bite -discussed diagnosis.  Discussed that 80% of these turn into an infection.  If the area gets worse, looks red, develops red streaking or expenses a fever and she needs to seek emergency medical care. Tdap given today.  Tx with amoxicillin which is she is currently taking for sinus infection but will add 4 more days to her regimen so she can have a complete  course.

## 2017-04-10 NOTE — Patient Instructions (Addendum)
Animal Bite Animal bites can range from mild to serious. An animal bite can result in a scratch on the skin, a deep open cut, a puncture of the skin, a crush injury, or tearing away of the skin or a body part. A small bite from a house pet will usually not cause serious problems. However, some animal bites can become infected or injure a bone or other tissue. Bites from certain animals can be more dangerous because of the risk of spreading rabies, which is a serious viral infection. This risk is higher with bites from stray animals or wild animals, such as raccoons, foxes, skunks, and bats. Dogs are responsible for most animal bites. Children are bitten more often than adults. What are the signs or symptoms? Common symptoms of an animal bite include:  Pain.  Bleeding.  Swelling.  Bruising.  How is this diagnosed? This condition may be diagnosed based on a physical exam and medical history. Your health care provider will examine the wound and ask for details about the animal and how the bite happened. You may also have tests, such as:  Blood tests to check for infection or to determine if surgery is needed.  X-rays to check for damage to bones or joints.  Culture test. This uses a sample of fluid from the wound to check for infection.  How is this treated? Treatment varies depending on the location and type of animal bite and your medical history. Treatment may include:  Wound care. This often includes cleaning the wound, flushing the wound with saline solution, and applying a bandage (dressing). Sometimes, the wound is left open to heal because of the high risk of infection. However, in some cases, the wound may be closed with stitches (sutures), staples, skin glue, or adhesive strips.  Antibiotic medicine.  Tetanus shot.  Rabies treatment if the animal could have rabies.  In some cases, bites that have become infected may require IV antibiotics and surgical treatment in the  hospital. Follow these instructions at home: Wound care  Follow instructions from your health care provider about how to take care of your wound. Make sure you: ? Wash your hands with soap and water before you change your dressing. If soap and water are not available, use hand sanitizer. ? Change your dressing as told by your health care provider. ? Leave sutures, skin glue, or adhesive strips in place. These skin closures may need to be in place for 2 weeks or longer. If adhesive strip edges start to loosen and curl up, you may trim the loose edges. Do not remove adhesive strips completely unless your health care provider tells you to do that.  Check your wound every day for signs of infection. Watch for: ? Increasing redness, swelling, or pain. ? Fluid, blood, or pus. General instructions  Take or apply over-the-counter and prescription medicines only as told by your health care provider.  If you were prescribed an antibiotic, take or apply it as told by your health care provider. Do not stop using the antibiotic even if your condition improves.  Keep the injured area raised (elevated) above the level of your heart while you are sitting or lying down, if this is possible.  If directed, apply ice to the injured area. ? Put ice in a plastic bag. ? Place a towel between your skin and the bag. ? Leave the ice on for 20 minutes, 2-3 times per day.  Keep all follow-up visits as told by your health care   provider. This is important. Contact a health care provider if:  You have increasing redness, swelling, or pain at the site of your wound.  You have a general feeling of sickness (malaise).  You feel nauseous or you vomit.  You have pain that does not get better. Get help right away if:  You have a red streak extending away from your wound.  You have fluid, blood, or pus coming from your wound.  You have a fever or chills.  You have trouble moving your injured area.  You have  numbness or tingling extending beyond the wound. This information is not intended to replace advice given to you by your health care provider. Make sure you discuss any questions you have with your health care provider. Document Released: 11/26/2010 Document Revised: 07/18/2015 Document Reviewed: 07/26/2014 Elsevier Interactive Patient Education  2018 Elsevier Inc.  

## 2017-05-29 ENCOUNTER — Ambulatory Visit (INDEPENDENT_AMBULATORY_CARE_PROVIDER_SITE_OTHER): Payer: BLUE CROSS/BLUE SHIELD | Admitting: Physician Assistant

## 2017-05-29 ENCOUNTER — Encounter: Payer: Self-pay | Admitting: Physician Assistant

## 2017-05-29 VITALS — BP 161/98 | HR 88 | Wt 182.0 lb

## 2017-05-29 DIAGNOSIS — E663 Overweight: Secondary | ICD-10-CM | POA: Insufficient documentation

## 2017-05-29 DIAGNOSIS — I10 Essential (primary) hypertension: Secondary | ICD-10-CM | POA: Diagnosis not present

## 2017-05-29 DIAGNOSIS — R0789 Other chest pain: Secondary | ICD-10-CM

## 2017-05-29 DIAGNOSIS — Z566 Other physical and mental strain related to work: Secondary | ICD-10-CM | POA: Insufficient documentation

## 2017-05-29 MED ORDER — LISINOPRIL 20 MG PO TABS: 20.0000 mg | ORAL_TABLET | Freq: Every day | ORAL | 3 refills | Status: DC

## 2017-05-29 NOTE — Progress Notes (Signed)
HPI:                                                                Pamela ChapelWendi R Harper-Bartell is a 47 y.o. female who [facility] today for elevated blood pressure and palpitations   HTN: has noticed elevated BP's at work and at home for the last 3 weeks Checks BP's at home. BP range 130's-140's/80's-90's, highest was 154/110. States highest HR was 104. Reports she has had headache, palpitations, chest tightness and a feeling of uneasiness for the last 3 weeks. Denies vision change, chest pain with exertion, orthopnea, lightheadedness, syncope and edema. Family history significant for HTN in parents and brother. Risk factors include: prediabetes/hx of gestational diabetes, family hx of heart disease  Chest pain: reports intermittent retrosternal chest discomfort that she is unable to describe. "It just feels strange." Pain has been daily for the last 3 weeks. Pain is intermittent and not associated with exertion, does not radiate. She has had episodes of palpitaitons and heart fluttering. She has dyspnea with moderate exertion, such as climbing a flight of stairs. She states her job has been very stressful for the last 3 months and is wondering if symptoms could be related.  Past Medical History:  Diagnosis Date  . DIABETES MELLITUS, GESTATIONAL, INSULIN-DEPENDENT 03/05/2007   Qualifier: Diagnosis of  By: Thomos LemonsBowen DO, Karen    . Gestational diabetes   . Hypertension   . PCOS (polycystic ovarian syndrome)    Past Surgical History:  Procedure Laterality Date  . NO PAST SURGERIES     Social History   Tobacco Use  . Smoking status: Never Smoker  . Smokeless tobacco: Never Used  Substance Use Topics  . Alcohol use: Yes    Alcohol/week: 0.0 - 0.5 oz   family history includes Diabetes Mellitus II in her father and mother; Heart disease in her father; Hypercholesterolemia in her father; Hypertension in her brother, father, and  mother.    ROS: negative except as noted in the HPI  Medications: No current outpatient medications on file.   No current facility-administered medications for this visit.    No Known Allergies     Objective:  BP (!) 161/98   Pulse 88   Wt 182 lb (82.6 kg)   SpO2 100%   BMI 29.38 kg/m  Gen:  alert, not ill-appearing, no distress, appropriate for age HEENT: head normocephalic without obvious abnormality, conjunctiva and cornea clear, trachea midline Pulm: Normal work of breathing, normal phonation, clear to auscultation bilaterally, no wheezes, rales or rhonchi; radial pulses 2+ symmetric CV: Normal rate, regular rhythm, s1 and s2 distinct, no murmurs, clicks or rubs  Neuro: alert and oriented x 3, no tremor MSK: extremities atraumatic, normal gait and station Skin: intact, no rashes on exposed skin, no jaundice, no cyanosis Psych: well-groomed, cooperative, good eye contact, very anxious affect, speech is articulate, and thought processes clear and goal-directed  ECG 4:22 pm 05/29/2017 Vent rate 78 bpm PR-I 142 ms QRS 76 ms QT/QTc 354/403   No results found for this or any previous visit (from the past 72 hour(s)). No results found.    Assessment and Plan: 47 y.o. female with   1. Hypertension goal BP (blood pressure) < 130/80  BP Readings from Last 3 Encounters:  05/29/17 (!) 161/98  04/10/17 140/90  04/06/17 137/84  - uncontrolled stage 2 hypertension - personally reviewed recent labs from annual wellness exam, no abnormalities apart from prediabetes Patient reports that her BP is typically normal, but she appears to have had hypertension for the time she has been followed at this practice. She feels this is white coat. Regardless, her home BP readings are also out of range now. She has a strong family history of HTN/heart disease. Recommend starting antihypertensive medication with ACE. Counseled on therapeutic lifestyle changes. Continue to log BP's at home and  follow-up in 2 weeks. - COMPLETE METABOLIC PANEL WITH GFR - Urinalysis, Routine w reflex microscopic - TSH + free T4 - lisinopril (PRINIVIL,ZESTRIL) 20 MG tablet; Take 1 tablet (20 mg total) by mouth daily.  Dispense: 30 tablet; Refill: 3 - Exercise Tolerance Test; Future  2. Atypical chest pain - EKG 12-Lead NSR, no dysrhythmia, no AV block, no ST or T wave abnormalities - ordered exercise stress test, low suspicion for ACS  3. Overweight (BMI 25.0-29.9) Wt Readings from Last 3 Encounters:  05/29/17 182 lb (82.6 kg)  04/06/17 177 lb (80.3 kg)  01/30/16 181 lb (82.1 kg)  - counseled on weight loss through decreased caloric intake and increased aerobic exercise   4. Stress at work - patient asked for non-medication recommendations. Recommended exercise and contacting her employer's EAP program for stress management   Patient education and anticipatory guidance given Patient agrees with treatment plan Follow-up in 2 weeks or sooner as needed if symptoms worsen or fail to improve  Levonne Hubert PA-C

## 2017-05-29 NOTE — Patient Instructions (Addendum)
For your blood pressure: - Goal <130/80 - start Lisinopril 20 mg every morning - continue to monitor and log blood pressures at home - check around the same time each day in a relaxed setting - Limit salt to <2000 mg/day - Follow DASH eating plan - limit alcohol to 2 standard drinks per day for men and 1 per day for women - avoid tobacco products - weight loss: 7% of current body weight - follow-up every 6 months for your blood pressure

## 2017-06-08 ENCOUNTER — Telehealth: Payer: Self-pay

## 2017-06-08 DIAGNOSIS — I10 Essential (primary) hypertension: Secondary | ICD-10-CM

## 2017-06-08 MED ORDER — IRBESARTAN 150 MG PO TABS: 150.0000 mg | ORAL_TABLET | Freq: Every day | ORAL | 3 refills | Status: DC

## 2017-06-08 NOTE — Telephone Encounter (Signed)
Pamela Stevens called and states she has a cough from taking the Lisinopril. She states it is getting worse. Denies any cold symptoms. Please advise.

## 2017-06-08 NOTE — Telephone Encounter (Signed)
New Rx sent for Irbesartan Follow-up with Dr. Linford ArnoldMetheney in 2-4 weeks

## 2017-06-09 NOTE — Telephone Encounter (Signed)
Patient advised and scheduled.  

## 2017-06-12 ENCOUNTER — Ambulatory Visit: Payer: Self-pay | Admitting: Family Medicine

## 2017-06-30 ENCOUNTER — Encounter: Payer: Self-pay | Admitting: Family Medicine

## 2017-06-30 ENCOUNTER — Ambulatory Visit (INDEPENDENT_AMBULATORY_CARE_PROVIDER_SITE_OTHER): Payer: BLUE CROSS/BLUE SHIELD | Admitting: Family Medicine

## 2017-06-30 VITALS — BP 130/72 | HR 88 | Ht 66.0 in | Wt 181.0 lb

## 2017-06-30 DIAGNOSIS — Z6829 Body mass index (BMI) 29.0-29.9, adult: Secondary | ICD-10-CM

## 2017-06-30 DIAGNOSIS — I10 Essential (primary) hypertension: Secondary | ICD-10-CM | POA: Diagnosis not present

## 2017-06-30 MED ORDER — AMLODIPINE BESYLATE 2.5 MG PO TABS: 2.5000 mg | ORAL_TABLET | Freq: Every day | ORAL | 0 refills | Status: DC
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
## 2017-06-30 NOTE — Patient Instructions (Signed)
DASH Eating Plan DASH stands for "Dietary Approaches to Stop Hypertension." The DASH eating plan is a healthy eating plan that has been shown to reduce high blood pressure (hypertension). It may also reduce your risk for type 2 diabetes, heart disease, and stroke. The DASH eating plan may also help with weight loss. What are tips for following this plan? General guidelines  Avoid eating more than 2,300 mg (milligrams) of salt (sodium) a day. If you have hypertension, you may need to reduce your sodium intake to 1,500 mg a day.  Limit alcohol intake to no more than 1 drink a day for nonpregnant women and 2 drinks a day for men. One drink equals 12 oz of beer, 5 oz of wine, or 1 oz of hard liquor.  Work with your health care provider to maintain a healthy body weight or to lose weight. Ask what an ideal weight is for you.  Get at least 30 minutes of exercise that causes your heart to beat faster (aerobic exercise) most days of the week. Activities may include walking, swimming, or biking.  Work with your health care provider or diet and nutrition specialist (dietitian) to adjust your eating plan to your individual calorie needs. Reading food labels  Check food labels for the amount of sodium per serving. Choose foods with less than 5 percent of the Daily Value of sodium. Generally, foods with less than 300 mg of sodium per serving fit into this eating plan.  To find whole grains, look for the word "whole" as the first word in the ingredient list. Shopping  Buy products labeled as "low-sodium" or "no salt added."  Buy fresh foods. Avoid canned foods and premade or frozen meals. Cooking  Avoid adding salt when cooking. Use salt-free seasonings or herbs instead of table salt or sea salt. Check with your health care provider or pharmacist before using salt substitutes.  Do not fry foods. Cook foods using healthy methods such as baking, boiling, grilling, and broiling instead.  Cook with  heart-healthy oils, such as olive, canola, soybean, or sunflower oil. Meal planning   Eat a balanced diet that includes: ? 5 or more servings of fruits and vegetables each day. At each meal, try to fill half of your plate with fruits and vegetables. ? Up to 6-8 servings of whole grains each day. ? Less than 6 oz of lean meat, poultry, or fish each day. A 3-oz serving of meat is about the same size as a deck of cards. One egg equals 1 oz. ? 2 servings of low-fat dairy each day. ? A serving of nuts, seeds, or beans 5 times each week. ? Heart-healthy fats. Healthy fats called Omega-3 fatty acids are found in foods such as flaxseeds and coldwater fish, like sardines, salmon, and mackerel.  Limit how much you eat of the following: ? Canned or prepackaged foods. ? Food that is high in trans fat, such as fried foods. ? Food that is high in saturated fat, such as fatty meat. ? Sweets, desserts, sugary drinks, and other foods with added sugar. ? Full-fat dairy products.  Do not salt foods before eating.  Try to eat at least 2 vegetarian meals each week.  Eat more home-cooked food and less restaurant, buffet, and fast food.  When eating at a restaurant, ask that your food be prepared with less salt or no salt, if possible. What foods are recommended? The items listed may not be a complete list. Talk with your dietitian about what   dietary choices are best for you. Grains Whole-grain or whole-wheat bread. Whole-grain or whole-wheat pasta. Brown rice. Oatmeal. Quinoa. Bulgur. Whole-grain and low-sodium cereals. Pita bread. Low-fat, low-sodium crackers. Whole-wheat flour tortillas. Vegetables Fresh or frozen vegetables (raw, steamed, roasted, or grilled). Low-sodium or reduced-sodium tomato and vegetable juice. Low-sodium or reduced-sodium tomato sauce and tomato paste. Low-sodium or reduced-sodium canned vegetables. Fruits All fresh, dried, or frozen fruit. Canned fruit in natural juice (without  added sugar). Meat and other protein foods Skinless chicken or turkey. Ground chicken or turkey. Pork with fat trimmed off. Fish and seafood. Egg whites. Dried beans, peas, or lentils. Unsalted nuts, nut butters, and seeds. Unsalted canned beans. Lean cuts of beef with fat trimmed off. Low-sodium, lean deli meat. Dairy Low-fat (1%) or fat-free (skim) milk. Fat-free, low-fat, or reduced-fat cheeses. Nonfat, low-sodium ricotta or cottage cheese. Low-fat or nonfat yogurt. Low-fat, low-sodium cheese. Fats and oils Soft margarine without trans fats. Vegetable oil. Low-fat, reduced-fat, or light mayonnaise and salad dressings (reduced-sodium). Canola, safflower, olive, soybean, and sunflower oils. Avocado. Seasoning and other foods Herbs. Spices. Seasoning mixes without salt. Unsalted popcorn and pretzels. Fat-free sweets. What foods are not recommended? The items listed may not be a complete list. Talk with your dietitian about what dietary choices are best for you. Grains Baked goods made with fat, such as croissants, muffins, or some breads. Dry pasta or rice meal packs. Vegetables Creamed or fried vegetables. Vegetables in a cheese sauce. Regular canned vegetables (not low-sodium or reduced-sodium). Regular canned tomato sauce and paste (not low-sodium or reduced-sodium). Regular tomato and vegetable juice (not low-sodium or reduced-sodium). Pickles. Olives. Fruits Canned fruit in a light or heavy syrup. Fried fruit. Fruit in cream or butter sauce. Meat and other protein foods Fatty cuts of meat. Ribs. Fried meat. Bacon. Sausage. Bologna and other processed lunch meats. Salami. Fatback. Hotdogs. Bratwurst. Salted nuts and seeds. Canned beans with added salt. Canned or smoked fish. Whole eggs or egg yolks. Chicken or turkey with skin. Dairy Whole or 2% milk, cream, and half-and-half. Whole or full-fat cream cheese. Whole-fat or sweetened yogurt. Full-fat cheese. Nondairy creamers. Whipped toppings.  Processed cheese and cheese spreads. Fats and oils Butter. Stick margarine. Lard. Shortening. Ghee. Bacon fat. Tropical oils, such as coconut, palm kernel, or palm oil. Seasoning and other foods Salted popcorn and pretzels. Onion salt, garlic salt, seasoned salt, table salt, and sea salt. Worcestershire sauce. Tartar sauce. Barbecue sauce. Teriyaki sauce. Soy sauce, including reduced-sodium. Steak sauce. Canned and packaged gravies. Fish sauce. Oyster sauce. Cocktail sauce. Horseradish that you find on the shelf. Ketchup. Mustard. Meat flavorings and tenderizers. Bouillon cubes. Hot sauce and Tabasco sauce. Premade or packaged marinades. Premade or packaged taco seasonings. Relishes. Regular salad dressings. Where to find more information:  National Heart, Lung, and Blood Institute: www.nhlbi.nih.gov  American Heart Association: www.heart.org Summary  The DASH eating plan is a healthy eating plan that has been shown to reduce high blood pressure (hypertension). It may also reduce your risk for type 2 diabetes, heart disease, and stroke.  With the DASH eating plan, you should limit salt (sodium) intake to 2,300 mg a day. If you have hypertension, you may need to reduce your sodium intake to 1,500 mg a day.  When on the DASH eating plan, aim to eat more fresh fruits and vegetables, whole grains, lean proteins, low-fat dairy, and heart-healthy fats.  Work with your health care provider or diet and nutrition specialist (dietitian) to adjust your eating plan to your individual   calorie needs. This information is not intended to replace advice given to you by your health care provider. Make sure you discuss any questions you have with your health care provider. Document Released: 02/27/2011 Document Revised: 03/03/2016 Document Reviewed: 03/03/2016 Elsevier Interactive Patient Education  2018 Elsevier Inc.  

## 2017-07-02 ENCOUNTER — Ambulatory Visit: Payer: Self-pay | Admitting: Family Medicine

## 2017-08-10 ENCOUNTER — Other Ambulatory Visit: Payer: Self-pay | Admitting: Physician Assistant

## 2017-08-10 DIAGNOSIS — I10 Essential (primary) hypertension: Secondary | ICD-10-CM

## 2017-08-13 ENCOUNTER — Encounter: Payer: Self-pay | Admitting: Cardiology

## 2017-09-11 ENCOUNTER — Ambulatory Visit (INDEPENDENT_AMBULATORY_CARE_PROVIDER_SITE_OTHER): Payer: BLUE CROSS/BLUE SHIELD

## 2017-09-11 DIAGNOSIS — R0789 Other chest pain: Secondary | ICD-10-CM

## 2017-09-11 DIAGNOSIS — I10 Essential (primary) hypertension: Secondary | ICD-10-CM

## 2017-09-11 LAB — EXERCISE TOLERANCE TEST
CHL RATE OF PERCEIVED EXERTION: 15
CSEPED: 9 min
CSEPEDS: 9 s
CSEPHR: 97 %
Estimated workload: 10.1 METS
MPHR: 174 {beats}/min
Peak HR: 169 {beats}/min
Rest HR: 88 {beats}/min

## 2017-09-20 ENCOUNTER — Other Ambulatory Visit: Payer: Self-pay | Admitting: Family Medicine

## 2017-09-26 ENCOUNTER — Other Ambulatory Visit: Payer: Self-pay | Admitting: Physician Assistant

## 2017-09-26 DIAGNOSIS — I10 Essential (primary) hypertension: Secondary | ICD-10-CM

## 2017-11-06 ENCOUNTER — Telehealth: Payer: Self-pay | Admitting: *Deleted

## 2017-11-06 ENCOUNTER — Other Ambulatory Visit: Payer: Self-pay | Admitting: *Deleted

## 2017-11-06 DIAGNOSIS — I10 Essential (primary) hypertension: Secondary | ICD-10-CM

## 2017-11-06 MED ORDER — AMLODIPINE BESYLATE 2.5 MG PO TABS: 2.5000 mg | ORAL_TABLET | Freq: Every day | ORAL | 0 refills | Status: DC

## 2017-11-06 MED ORDER — IRBESARTAN 150 MG PO TABS: 150.0000 mg | ORAL_TABLET | Freq: Every day | ORAL | 0 refills | Status: DC

## 2017-11-06 NOTE — Telephone Encounter (Signed)
Called pt and lvm informing her that 30 day supply would be sent to her local pharmacy .Heath GoldBarkley, Anabeth Chilcott Lynetta, CMA

## 2017-11-06 NOTE — Telephone Encounter (Signed)
Called pt to inform her that she will need to schedule a f/u appt and have labs to ensure that the medication is not affecting her kidneys or liver.

## 2017-12-15 ENCOUNTER — Telehealth: Payer: Self-pay | Admitting: Family Medicine

## 2017-12-15 ENCOUNTER — Encounter: Payer: Self-pay | Admitting: Family Medicine

## 2017-12-15 ENCOUNTER — Ambulatory Visit (INDEPENDENT_AMBULATORY_CARE_PROVIDER_SITE_OTHER): Payer: BLUE CROSS/BLUE SHIELD | Admitting: Family Medicine

## 2017-12-15 VITALS — BP 136/82 | HR 81 | Ht 66.0 in | Wt 176.0 lb

## 2017-12-15 DIAGNOSIS — R42 Dizziness and giddiness: Secondary | ICD-10-CM

## 2017-12-15 DIAGNOSIS — L659 Nonscarring hair loss, unspecified: Secondary | ICD-10-CM

## 2017-12-15 DIAGNOSIS — I10 Essential (primary) hypertension: Secondary | ICD-10-CM

## 2017-12-15 DIAGNOSIS — R7301 Impaired fasting glucose: Secondary | ICD-10-CM

## 2017-12-15 DIAGNOSIS — R0789 Other chest pain: Secondary | ICD-10-CM

## 2017-12-15 LAB — POCT GLYCOSYLATED HEMOGLOBIN (HGB A1C): Hemoglobin A1C: 5.6 % (ref 4.0–5.6)

## 2017-12-15 MED ORDER — AMLODIPINE BESYLATE 5 MG PO TABS: 5.0000 mg | ORAL_TABLET | Freq: Every day | ORAL | 0 refills | Status: DC

## 2017-12-15 MED ORDER — AMLODIPINE BESYLATE 5 MG PO TABS: 5.0000 mg | ORAL_TABLET | Freq: Every day | ORAL | 1 refills | Status: DC

## 2017-12-15 NOTE — Telephone Encounter (Signed)
Call patient and let her know that I did order some blood work if she is able to get that done in the next week or 2.  I did put a couple extra test on there since she has been having hair loss just to make sure that it is not caused from anemia or B12 deficiency.

## 2017-12-15 NOTE — Patient Instructions (Addendum)
Hold your Avapro. I have increased your amlodipine to 5mg .

## 2017-12-15 NOTE — Progress Notes (Addendum)
Subjective:    CC: HTN  HPI:  Hypertension- Pt denies chest pain, SOB, dizziness, or heart palpitations.  Taking meds as directed w/o problems.  Denies medication side effects.  she has been out of the Irbesartan for about 3 wks. She thinks her BP meds are causing her hair to fall out.   Impaired fasting glucose-no increased thirst or urination. No symptoms consistent with hypoglycemia.  She is also concerned about the possibility of medication causing hair loss.  She says that the numbness coming out in clumps but it seems to be diffuse over her scalp.  No other known triggers or problems.  No worsening or alleviating factors.  She also complains of some dizziness and feeling lightheaded really for couple months.  She says it some most like being a little bit off balance.  And sometimes a most like she is going to pass out but she is never had any near syncope or actual syncope.  She has had some intermittent nasal congestion and sometimes pressure in her ears but says has not had any other symptoms over the last week and still continues to just feel a little off balance.  She has been flying a lot since July for work.  Is also experienced some occasional tightness in her chest and on one occasion had some palpitations but her new job position has been somewhat stressful.  It is random when it occurs and just usually lasts a few minutes.  Her father recently had triple bypass and has not been recovering very well.  Past medical history, Surgical history, Family history not pertinant except as noted below, Social history, Allergies, and medications have been entered into the medical record, reviewed, and corrections made.   Review of Systems: No fevers, chills, night sweats, weight loss, chest pain, or shortness of breath.   Objective:    General: Well Developed, well nourished, and in no acute distress.  Neuro: Alert and oriented x3, extra-ocular muscles intact, sensation grossly intact.   HEENT: Normocephalic, atraumatic  Skin: Warm and dry, no rashes. Cardiac: Regular rate and rhythm, no murmurs rubs or gallops, no lower extremity edema.  Respiratory: Clear to auscultation bilaterally. Not using accessory muscles, speaking in full sentences. Neuro: Cranial nerves II through XII intact, negative Dix-Hallpike maneuver.  Normal Gait.   Impression and Recommendations:    HTN - BP borderline today. Will hold the ibersartan for an additional 4 weeks, she is Artie been off of it for 3 so that will be a total of 7 weeks to see if it could be triggering some of the hair loss that she is been experiencing.  Is predominantly diffuse hair loss.  In the meantime we will increase her amlodipine to 5 mg to compensate.  Did warn about potential for lower extremity swelling and to keep an eye on that.  Like to have her come back in a month to recheck pressure.  We can always restart the Avapro if needed.  IFG - A1c of 5.6 today. Well controlled. Continue current regimen. Follow up in  12 months.    Hair loss - we are going to hold her Avapro to better evaluate if it could be a medication side effect. She has already been off it for 3 weeks.  Hold for one more month.    Atypical chest pain-EKG today shows 78 bpm, normal sinus rhythm with no acute ST-T wave changes.  She is some poor R wave progression in the lateral leads.  Sounds like  it is more likely to be stress-induced versus cardiac.  Lightheadedness-unclear etiology.  She does not feel that she is having any current allergy or sinus symptoms.  Negative Dix-Hallpike maneuver on exam today.  It does not seem to be orthostatic based on her description.  We will get some up-to-date blood work and rule out anemia etc.  We also discussed possibly a trial of an antihistamine over the next couple weeks if not improving then please let me know.

## 2017-12-16 NOTE — Telephone Encounter (Signed)
Pt advised. Verbalized understanding.

## 2017-12-17 DIAGNOSIS — L659 Nonscarring hair loss, unspecified: Secondary | ICD-10-CM | POA: Diagnosis not present

## 2017-12-17 DIAGNOSIS — R0789 Other chest pain: Secondary | ICD-10-CM | POA: Diagnosis not present

## 2017-12-17 DIAGNOSIS — R7301 Impaired fasting glucose: Secondary | ICD-10-CM | POA: Diagnosis not present

## 2017-12-17 DIAGNOSIS — I10 Essential (primary) hypertension: Secondary | ICD-10-CM | POA: Diagnosis not present

## 2017-12-18 LAB — COMPLETE METABOLIC PANEL WITH GFR
AG Ratio: 1.8 (calc) (ref 1.0–2.5)
ALT: 14 U/L (ref 6–29)
AST: 17 U/L (ref 10–35)
Albumin: 4.9 g/dL (ref 3.6–5.1)
Alkaline phosphatase (APISO): 70 U/L (ref 33–115)
BILIRUBIN TOTAL: 0.5 mg/dL (ref 0.2–1.2)
BUN: 18 mg/dL (ref 7–25)
CALCIUM: 9.9 mg/dL (ref 8.6–10.2)
CO2: 28 mmol/L (ref 20–32)
CREATININE: 0.96 mg/dL (ref 0.50–1.10)
Chloride: 102 mmol/L (ref 98–110)
GFR, EST AFRICAN AMERICAN: 82 mL/min/{1.73_m2} (ref >=60)
GFR, Est Non African American: 71 mL/min/{1.73_m2} (ref >=60)
Globulin: 2.7 g/dL (calc) (ref 1.9–3.7)
Glucose, Bld: 102 mg/dL — ABNORMAL HIGH (ref 65–99)
Potassium: 4.5 mmol/L (ref 3.5–5.3)
Sodium: 139 mmol/L (ref 135–146)
Total Protein: 7.6 g/dL (ref 6.1–8.1)

## 2017-12-18 LAB — LIPID PANEL
CHOL/HDL RATIO: 5.6 (calc) — AB (ref <5.0)
Cholesterol: 236 mg/dL — ABNORMAL HIGH (ref <200)
HDL: 42 mg/dL — ABNORMAL LOW (ref >50)
LDL CHOLESTEROL (CALC): 144 mg/dL — AB
Non-HDL Cholesterol (Calc): 194 mg/dL (calc) — ABNORMAL HIGH (ref <130)
TRIGLYCERIDES: 328 mg/dL — AB (ref <150)

## 2017-12-18 LAB — CBC
HCT: 42.9 % (ref 35.0–45.0)
Hemoglobin: 14.5 g/dL (ref 11.7–15.5)
MCH: 30 pg (ref 27.0–33.0)
MCHC: 33.8 g/dL (ref 32.0–36.0)
MCV: 88.6 fL (ref 80.0–100.0)
MPV: 9.8 fL (ref 7.5–12.5)
PLATELETS: 323 10*3/uL (ref 140–400)
RBC: 4.84 10*6/uL (ref 3.80–5.10)
RDW: 12.4 % (ref 11.0–15.0)
WBC: 7.7 10*3/uL (ref 3.8–10.8)

## 2017-12-18 LAB — VITAMIN B12: Vitamin B-12: 452 pg/mL (ref 200–1100)

## 2017-12-18 LAB — TSH: TSH: 1.48 mIU/L

## 2017-12-25 ENCOUNTER — Encounter: Payer: Self-pay | Admitting: Family Medicine

## 2018-01-09 ENCOUNTER — Other Ambulatory Visit: Payer: Self-pay | Admitting: Family Medicine

## 2018-01-20 ENCOUNTER — Ambulatory Visit: Payer: Self-pay | Admitting: Family Medicine

## 2018-01-20 NOTE — Progress Notes (Deleted)
Subjective:    CC: HTN, glucose  HPI:  Hypertension- Pt denies chest pain, SOB, dizziness, or heart palpitations.  Taking meds as directed w/o problems.  Denies medication side effects.    Impaired fasting glucose-no increased thirst or urination. No symptoms consistent with hypoglycemia.   Past medical history, Surgical history, Family history not pertinant except as noted below, Social history, Allergies, and medications have been entered into the medical record, reviewed, and corrections made.   Review of Systems: No fevers, chills, night sweats, weight loss, chest pain, or shortness of breath.   Objective:    General: Well Developed, well nourished, and in no acute distress.  Neuro: Alert and oriented x3, extra-ocular muscles intact, sensation grossly intact.  HEENT: Normocephalic, atraumatic  Skin: Warm and dry, no rashes. Cardiac: Regular rate and rhythm, no murmurs rubs or gallops, no lower extremity edema.  Respiratory: Clear to auscultation bilaterally. Not using accessory muscles, speaking in full sentences.   Impression and Recommendations:    HTN -   IFG -

## 2018-02-04 ENCOUNTER — Ambulatory Visit (INDEPENDENT_AMBULATORY_CARE_PROVIDER_SITE_OTHER): Payer: BLUE CROSS/BLUE SHIELD | Admitting: Family Medicine

## 2018-02-04 ENCOUNTER — Encounter: Payer: Self-pay | Admitting: Family Medicine

## 2018-02-04 VITALS — BP 122/64 | HR 93 | Ht 66.0 in | Wt 182.0 lb

## 2018-02-04 DIAGNOSIS — L659 Nonscarring hair loss, unspecified: Secondary | ICD-10-CM

## 2018-02-04 DIAGNOSIS — I10 Essential (primary) hypertension: Secondary | ICD-10-CM | POA: Diagnosis not present

## 2018-02-04 DIAGNOSIS — R221 Localized swelling, mass and lump, neck: Secondary | ICD-10-CM

## 2018-02-04 NOTE — Progress Notes (Signed)
Subjective:    CC:   HPI: Hypertension- Pt denies chest pain, SOB, dizziness, or heart palpitations.  Taking meds as directed w/o problems.  Denies medication side effects.  We discontinued her Avapro because she felt like it was causing hair loss and we actually increased her amlodipine from 2.5 up to 5 mg.  She is here today for repeat blood pressure check.  Follow-up hair loss-she felt was secondary to prescription medication.  B12, thyroid, CBC were normal.  She says her hair is coming back in nicely.  She is been working with her hairdresser on this as well.  She thinks coming off the medication has made a big difference.  A knot on the back of her neck just to the right side.  She thinks it may just be a bunched muscle but she was not sure.  She says is really not causing a lot of pain or discomfort but she noticed it.  She is smashed her left little finger in a door about a year ago and ever since then she is noticed a red mark underneath the nail.  She wonders if there is anything she can do to make it go away.  Past medical history, Surgical history, Family history not pertinant except as noted below, Social history, Allergies, and medications have been entered into the medical record, reviewed, and corrections made.   Review of Systems: No fevers, chills, night sweats, weight loss, chest pain, or shortness of breath.   Objective:    General: Well Developed, well nourished, and in no acute distress.  Neuro: Alert and oriented x3, extra-ocular muscles intact, sensation grossly intact.  HEENT: Normocephalic, atraumatic  Skin: Warm and dry, no rashes. Cardiac: Regular rate and rhythm, no murmurs rubs or gallops, no lower extremity edema.  Respiratory: Clear to auscultation bilaterally. Not using accessory muscles, speaking in full sentences. Neck: She does have just a little bit more prominent muscle tissue on the right compared to the left side of the paraspinous muscles.  Is  nontender on exam.  I do not feel a discrete mass such as a lymph node or sebaceous cyst.   Impression and Recommendations:    HTN - Well controlled. Continue current regimen. Follow up in  6months.    Hair loss -proving.  I do think it was likely secondary to medication side effect.  We have added that to her intolerance list.  And hopefully hair loss will continue to improve until resolution.  Knot on right side of posterior neck-most consistent with just muscle tissue spasm.  I do not feel a discrete mass such as a lymph node or cyst.  Gave reassurance.  If it is getting larger or starts to develop pain please let us know.  Injury to middle finger on left hand-I explained that if the injury occurred a year ago and she still has a permanent red mark that is not growing out as her nail grows out that it is likely permanent scar tissue.

## 2018-05-31 ENCOUNTER — Encounter: Payer: Self-pay | Admitting: Family Medicine

## 2018-05-31 ENCOUNTER — Ambulatory Visit (INDEPENDENT_AMBULATORY_CARE_PROVIDER_SITE_OTHER): Payer: BLUE CROSS/BLUE SHIELD | Admitting: Family Medicine

## 2018-05-31 VITALS — BP 125/87 | HR 89 | Temp 98.7°F | Ht 66.0 in | Wt 175.0 lb

## 2018-05-31 DIAGNOSIS — J22 Unspecified acute lower respiratory infection: Secondary | ICD-10-CM

## 2018-05-31 MED ORDER — HYDROCODONE-HOMATROPINE 5-1.5 MG/5ML PO SYRP: 5.0000 mL | ORAL_SOLUTION | Freq: Every evening | ORAL | 0 refills | Status: DC | PRN

## 2018-05-31 MED ORDER — BENZONATATE 200 MG PO CAPS: 200.0000 mg | ORAL_CAPSULE | Freq: Two times a day (BID) | ORAL | 0 refills | Status: DC | PRN

## 2018-05-31 NOTE — Progress Notes (Signed)
Acute Office Visit  Subjective:    Patient ID: Pamela Stevens, female    DOB: 08-Jan-1971, 48 y.o.   MRN: [id number]  Chief Complaint  Patient presents with  . Cough    sxs x 5 days she has had f/s/c coughing up yellow/brownish mucous. she reports that she feels nauseated from coughing so much. she has only taken Advil and nyquil for relief.    HPI Patient is in today for sxs x 5 days she has had f/s/c coughing up yellow/brownish mucous. she reports that she feels nauseated from coughing so much. she has only taken Advil and nyquil for relief.  Denies any significant nasal congestion but has had a little bit of a runny nose.  Cough is been keeping her awake at night.  She is had a lot of tightness in her upper chest.  No wheezing.  No shortness of breath.  Past Medical History:  Diagnosis Date  . DIABETES MELLITUS, GESTATIONAL, INSULIN-DEPENDENT 03/05/2007   Qualifier: Diagnosis of  By: [name]    . Gestational diabetes   . Hypertension   . PCOS (polycystic ovarian syndrome)     Past Surgical History:  Procedure Laterality Date  . NO PAST SURGERIES      Family History  Problem Relation Age of Onset  . Diabetes Mellitus II Mother   . Hypertension Mother   . Diabetes Mellitus II Father   . Hypercholesterolemia Father   . Hypertension Father   . Heart disease Father   . Hypertension Brother     Social History   Socioeconomic History  . Marital status: Married    Spouse name: Not on file  . Number of children: 2  . Years of education: Not on file  . Highest education level: Not on file  Occupational History  . Occupation: Control and instrumentation engineer  Social Needs  . Financial resource strain: Not on file  . Food insecurity:    Worry: Not on file    Inability: Not on file  . Transportation needs:    Medical: Not on file    Non-medical: Not on file  Tobacco Use  . Smoking status: Never Smoker  . Smokeless tobacco: Never Used  Substance and  Sexual Activity  . Alcohol use: Yes    Alcohol/week: 0.0 - 1.0 standard drinks  . Drug use: Not on file  . Sexual activity: Yes    Partners: Male  Lifestyle  . Physical activity:    Days per week: Not on file    Minutes per session: Not on file  . Stress: Not on file  Relationships  . Social connections:    Talks on phone: Not on file    Gets together: Not on file    Attends religious service: Not on file    Active member of club or organization: Not on file    Attends meetings of clubs or organizations: Not on file    Relationship status: Not on file  . Intimate partner violence:    Fear of current or ex partner: Not on file    Emotionally abused: Not on file    Physically abused: Not on file    Forced sexual activity: Not on file  Other Topics Concern  . Not on file  Social History Narrative   Some exercising. 1-2 caffeine drinks per day.     Outpatient Medications Prior to Visit  Medication Sig Dispense Refill  . amLODipine (NORVASC) 5 MG tablet Take 1 tablet (  5 mg total) by mouth daily. 30 tablet 0   No facility-administered medications prior to visit.     Allergies  Allergen Reactions  . Avapro [Irbesartan] Other (See Comments)    Hair Loss  . Lisinopril Cough    ROS     Objective:    Physical Exam  Constitutional: She is oriented to person, place, and time. She appears well-developed and well-nourished.  HENT:  Head: Normocephalic and atraumatic.  Right Ear: External ear normal.  Left Ear: External ear normal.  Nose: Nose normal.  Mouth/Throat: Oropharynx is clear and moist.  TMs and canals are clear.   Eyes: Pupils are equal, round, and reactive to light. Conjunctivae and EOM are normal.  Neck: Neck supple. No thyromegaly present.  Cardiovascular: Normal rate, regular rhythm and normal heart sounds.  Pulmonary/Chest: Effort normal and breath sounds normal. She has no wheezes.  Lymphadenopathy:    She has no cervical adenopathy.  Neurological: She  is alert and oriented to person, place, and time.  Skin: Skin is warm and dry.  Psychiatric: She has a normal mood and affect.    BP 125/87   Pulse 89   Temp 98.7 F (37.1 C)   Ht 5\' 6"  (1.676 m)   Wt 175 lb (79.4 kg)   SpO2 100%   BMI 28.25 kg/m  Wt Readings from Last 3 Encounters:  05/31/18 175 lb (79.4 kg)  02/04/18 182 lb (82.6 kg)  12/15/17 176 lb (79.8 kg)    Health Maintenance Due  Topic Date Due  . HIV Screening  08/13/1986    There are no preventive care reminders to display for this patient.   Lab Results  Component Value Date   TSH 1.48 12/17/2017   Lab Results  Component Value Date   WBC 7.7 12/17/2017   HGB 14.5 12/17/2017   HCT 42.9 12/17/2017   MCV 88.6 12/17/2017   PLT 323 12/17/2017   Lab Results  Component Value Date   NA 139 12/17/2017   K 4.5 12/17/2017   CO2 28 12/17/2017   GLUCOSE 102 (H) 12/17/2017   BUN 18 12/17/2017   CREATININE 0.96 12/17/2017   BILITOT 0.5 12/17/2017   ALKPHOS 73 12/23/2013   AST 17 12/17/2017   ALT 14 12/17/2017   PROT 7.6 12/17/2017   ALBUMIN 4.8 12/23/2013   CALCIUM 9.9 12/17/2017   Lab Results  Component Value Date   CHOL 236 (H) 12/17/2017   Lab Results  Component Value Date   HDL 42 (L) 12/17/2017   Lab Results  Component Value Date   LDLCALC 144 (H) 12/17/2017   Lab Results  Component Value Date   TRIG 328 (H) 12/17/2017   Lab Results  Component Value Date   CHOLHDL 5.6 (H) 12/17/2017   Lab Results  Component Value Date   HGBA1C 5.6 12/15/2017       Assessment & Plan:   Problem List Items Addressed This Visit    None    Visit Diagnoses    Lower respiratory infection    -  Primary     Lower respiratory infection-lungs are actually clear on exam and I gave her reassurance.  She is afebrile here in the office but does sound like she is been having some sweats and chills which could be consistent with a low-grade fever she is mostly been using ibuprofen for the headaches.  Okay to  continue symptomatic care.  Also given cough medicine to use during the day time and a cough  syrup to use at bedtime.  If she is not improving in the next couple days and please give [place] a call back and we will consider treat with antibiotic or if worsening.   Meds ordered this encounter  Medications  . benzonatate (TESSALON) 200 MG capsule    Sig: Take 1 capsule (200 mg total) by mouth 2 (two) times daily as needed for cough.    Dispense:  20 capsule    Refill:  0  . DISCONTD: HYDROcodone-homatropine (HYCODAN) 5-1.5 MG/5ML syrup    Sig: Take 5 mLs by mouth at bedtime as needed for cough.    Dispense:  120 mL    Refill:  0  . DISCONTD: HYDROcodone-homatropine (HYCODAN) 5-1.5 MG/5ML syrup    Sig: Take 5 mLs by mouth at bedtime as needed for cough.    Dispense:  120 mL    Refill:  0  . HYDROcodone-homatropine (HYCODAN) 5-1.5 MG/5ML syrup    Sig: Take 5 mLs by mouth at bedtime as needed for cough.    Dispense:  120 mL    Refill:  0     Nani Gasser, MD

## 2018-06-01 ENCOUNTER — Encounter: Payer: Self-pay | Admitting: Family Medicine

## 2018-06-01 MED ORDER — AZITHROMYCIN 250 MG PO TABS: ORAL_TABLET | ORAL | 0 refills | Status: AC

## 2018-07-02 ENCOUNTER — Other Ambulatory Visit: Payer: Self-pay | Admitting: Family Medicine

## 2018-08-05 ENCOUNTER — Ambulatory Visit (INDEPENDENT_AMBULATORY_CARE_PROVIDER_SITE_OTHER): Payer: BLUE CROSS/BLUE SHIELD | Admitting: Family Medicine

## 2018-08-05 ENCOUNTER — Encounter: Payer: Self-pay | Admitting: Family Medicine

## 2018-08-05 VITALS — Ht 66.0 in | Wt 164.0 lb

## 2018-08-05 DIAGNOSIS — R7301 Impaired fasting glucose: Secondary | ICD-10-CM | POA: Diagnosis not present

## 2018-08-05 DIAGNOSIS — I10 Essential (primary) hypertension: Secondary | ICD-10-CM | POA: Diagnosis not present

## 2018-08-05 DIAGNOSIS — Z8632 Personal history of gestational diabetes: Secondary | ICD-10-CM | POA: Diagnosis not present

## 2018-08-05 MED ORDER — AMLODIPINE BESYLATE 5 MG PO TABS: 5.0000 mg | ORAL_TABLET | Freq: Every day | ORAL | 1 refills | Status: DC

## 2018-08-05 MED ORDER — HYDROCHLOROTHIAZIDE 12.5 MG PO CAPS: 12.5000 mg | ORAL_CAPSULE | Freq: Every day | ORAL | 0 refills | Status: DC

## 2018-08-05 NOTE — Progress Notes (Signed)
She has  ?

## 2018-08-05 NOTE — Progress Notes (Signed)
Virtual Visit via Video Note  I connected with Pamela Stevens on 08/05/18 at  8:30 AM EDT by a video enabled telemedicine application and verified that I am speaking with the correct person using two identifiers.   I discussed the limitations of evaluation and management by telemedicine and the availability of in person appointments. The patient expressed understanding and agreed to proceed.  Pt was at home and I was in my office for the virtual visit.  Patient was sitting in her car.   Subjective:    CC: check BP and glucose  HPI: Hypertension- Pt denies chest pain, SOB, dizziness, or heart palpitations.  Taking meds as directed w/o problems.  Denies medication side effects.  When she checks her blood pressures at home her apple usually tell her if it is in the green zone yellow zone or red zone.  She said she has had a couple in the red zone but a lot of them are actually in the yellow zone which typically means the systolic is greater than 130.  She has been taking her amlodipine regularly without any problems or side effects.  The  Impaired fasting glucose-no increased thirst or urination. No symptoms consistent with hypoglycemia.  She is doing well. She is now working from home.  She actually really likes working from home.  She says she is more introverted and so finds this pretty easy actually.  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   Past medical history, Surgical history, Family history not pertinant except as noted below, Social history, Allergies, and medications have been entered into the medical record, reviewed, and corrections made.   Review of Systems: No fevers, chills, night sweats, weight loss, chest pain, or shortness of breath.   Objective:    General: Speaking clearly in complete sentences without any shortness of breath.  Alert and oriented x3.  Normal judgment. No apparent acute distress.   Well-groomed.    Impression and Recommendations:    HTN -Home blood pressures have been running mostly in the 130s so we discussed adjusting her regimen.  We will add 12.5 mg of HCTZ.  Did warn about potential side effects and plan to check a BMP in 1 month to make sure the potassium and kidney function are normal.  IFG -A1c was beautiful and in the normal range.  She has lost some weight recently.  Plan to recheck A1c in 1 month as well.  She is doing intermittant fasting. She got down to 164 lb.      I discussed the assessment and treatment plan with the patient. The patient was provided an opportunity to ask questions and all were answered. The patient agreed with the plan and demonstrated an understanding of the instructions.   The patient was advised to call back or seek an in-person evaluation if the symptoms worsen or if the condition fails to improve as anticipated.   Nani Gasser, MD

## 2018-08-05 NOTE — Patient Instructions (Signed)
Please send [place] a MyChart note in about 2 weeks if you are doing well in the HCTZ so we can send a 90-day supply to local pharmacy.  In addition you can let us know if blood pressures are better controlled.

## 2018-08-25 ENCOUNTER — Encounter: Payer: Self-pay | Admitting: Family Medicine

## 2018-08-30 ENCOUNTER — Other Ambulatory Visit: Payer: Self-pay | Admitting: *Deleted

## 2018-08-30 ENCOUNTER — Other Ambulatory Visit: Payer: Self-pay | Admitting: Family Medicine

## 2018-08-30 MED ORDER — HYDROCHLOROTHIAZIDE 25 MG PO TABS: 25.0000 mg | ORAL_TABLET | Freq: Every day | ORAL | 1 refills | Status: DC

## 2018-09-08 ENCOUNTER — Encounter: Payer: Self-pay | Admitting: Family Medicine

## 2018-09-10 NOTE — Telephone Encounter (Signed)
Left pt msg to call and schedule appt for migraines

## 2018-09-15 ENCOUNTER — Encounter: Payer: Self-pay | Admitting: Family Medicine

## 2018-09-15 ENCOUNTER — Ambulatory Visit (INDEPENDENT_AMBULATORY_CARE_PROVIDER_SITE_OTHER): Payer: BC Managed Care – PPO | Admitting: Family Medicine

## 2018-09-15 VITALS — BP 118/85 | HR 83 | Ht 66.0 in | Wt 169.0 lb

## 2018-09-15 DIAGNOSIS — E663 Overweight: Secondary | ICD-10-CM | POA: Diagnosis not present

## 2018-09-15 DIAGNOSIS — I10 Essential (primary) hypertension: Secondary | ICD-10-CM | POA: Diagnosis not present

## 2018-09-15 DIAGNOSIS — Z566 Other physical and mental strain related to work: Secondary | ICD-10-CM | POA: Diagnosis not present

## 2018-09-15 DIAGNOSIS — G43509 Persistent migraine aura without cerebral infarction, not intractable, without status migrainosus: Secondary | ICD-10-CM | POA: Diagnosis not present

## 2018-09-15 MED ORDER — AMLODIPINE BESYLATE 5 MG PO TABS: 10.0000 mg | ORAL_TABLET | Freq: Every day | ORAL | 1 refills | Status: DC

## 2018-09-15 MED ORDER — ELETRIPTAN HYDROBROMIDE 40 MG PO TABS: 40.0000 mg | ORAL_TABLET | ORAL | 0 refills | Status: DC | PRN

## 2018-09-15 MED ORDER — TOPIRAMATE 25 MG PO TABS: 25.0000 mg | ORAL_TABLET | Freq: Every day | ORAL | 0 refills | Status: DC

## 2018-09-15 NOTE — Assessment & Plan Note (Signed)
We discussed graded exercise.  And monitor for any symptoms such as lightheadedness or dizziness or significant changes in blood pressure and gradually advance that regimen every couple of weeks.  If she still continuing to have problems with exercise then please let me know.

## 2018-09-15 NOTE — Progress Notes (Signed)
Established Patient Office Visit  Subjective:  Patient ID: Pamela Stevens, female    DOB: 11/04/1970  Age: 48 y.o. MRN: [id number]  CC:  Chief Complaint  Patient presents with  . Headache    HPI Pamela Stevens presents for Headaches. Hx of migraine headaches.  Says has been getting more frequent HA. Had a head injury when a child.  Says has missed 2 days of work in the last 2 week. Stays hydrated. Usually takes 3 Advil, sleeps and gets in quiet area.  Had used Imitrex years ago.  Has been under more stress with her job.  No major dietary changes.  Had a panic attack earlier this week.  Hasn't been sleeping well.  Feels like her head is in a vice. She gets light and sound sensitivity.      Her job is just extremely stressful.  And she is 1 of the only people that can do her job.  She says she has called her boss and even her boss's boss, and even the VP over her area to let him know that she really needs help.  She says she is on conference calls literally from 8 AM to 5 PM and most days does anything get a chance to eat lunch.  She says she has been losing hair because of the stress.  And even started to get a bald spot where she was wearing and over the head microphone for work.  She has switch that out since.  She also had a twitch going on underneath her left eye for quite some time but did seem to have gotten a little bit better.  She is also now getting to the point where she is having some low-grade chronic daily headaches in addition to more frequent migraine headaches.  She does have a history of auras.  She describes them as seeing stars at night.  She did have an EKG and stress test earlier this year because she was experiencing some chest pain and occasional shortness of breath.  She said she did more recently try to start exercising again on the treadmill but says she actually started to feel lightheaded with exercise so stopped.  Hypertension follow-up-she says  overall her blood pressures have been better systolics have been in the 120s and upper teens.  But her diastolic pressure has still been mostly running in the upper 80s.  Otherwise she is been tolerating the medication well without any major side effects.   Past Medical History:  Diagnosis Date  . DIABETES MELLITUS, GESTATIONAL, INSULIN-DEPENDENT 03/05/2007   Qualifier: Diagnosis of  By: Esmeralda Arthur    . Gestational diabetes   . Hypertension   . PCOS (polycystic ovarian syndrome)     Past Surgical History:  Procedure Laterality Date  . NO PAST SURGERIES      Family History  Problem Relation Age of Onset  . Diabetes Mellitus II Mother   . Hypertension Mother   . Diabetes Mellitus II Father   . Hypercholesterolemia Father   . Hypertension Father   . Heart disease Father   . Hypertension Brother     Social History   Socioeconomic History  . Marital status: Married    Spouse name: Not on file  . Number of children: 2  . Years of education: Not on file  . Highest education level: Not on file  Occupational History  . Occupation: Engineer, maintenance (IT)  Social Needs  . Financial resource strain: Not  on file  . Food insecurity    Worry: Not on file    Inability: Not on file  . Transportation needs    Medical: Not on file    Non-medical: Not on file  Tobacco Use  . Smoking status: Never Smoker  . Smokeless tobacco: Never Used  Substance and Sexual Activity  . Alcohol use: Yes    Alcohol/week: 0.0 - 1.0 standard drinks  . Drug use: Not on file  . Sexual activity: Yes    Partners: Male  Lifestyle  . Physical activity    Days per week: Not on file    Minutes per session: Not on file  . Stress: Not on file  Relationships  . Social Herbalist on phone: Not on file    Gets together: Not on file    Attends religious service: Not on file    Active member of club or organization: Not on file    Attends meetings of clubs or organizations: Not on  file    Relationship status: Not on file  . Intimate partner violence    Fear of current or ex partner: Not on file    Emotionally abused: Not on file    Physically abused: Not on file    Forced sexual activity: Not on file  Other Topics Concern  . Not on file  Social History Narrative   Some exercising. 1-2 caffeine drinks per day.     Outpatient Medications Prior to Visit  Medication Sig Dispense Refill  . hydrochlorothiazide (HYDRODIURIL) 25 MG tablet Take 1 tablet (25 mg total) by mouth daily. 90 tablet 1  . amLODipine (NORVASC) 5 MG tablet Take 1 tablet (5 mg total) by mouth daily. 90 tablet 1  . raNITIdine HCl (WAL-ZAN 75 PO) Take 1 tablet by mouth daily.     No facility-administered medications prior to visit.     Allergies  Allergen Reactions  . Avapro [Irbesartan] Other (See Comments)    Hair Loss  . Lisinopril Cough    ROS Review of Systems    Objective:    Physical Exam  Constitutional: She is oriented to person, place, and time. She appears well-developed and well-nourished.  HENT:  Head: Normocephalic and atraumatic.  Cardiovascular: Normal rate, regular rhythm and normal heart sounds.  Pulmonary/Chest: Effort normal and breath sounds normal.  Neurological: She is alert and oriented to person, place, and time.  Skin: Skin is warm and dry.  Psychiatric: She has a normal mood and affect. Her behavior is normal.    BP 118/85   Pulse 83   Ht 5' 6"  (1.676 m)   Wt 169 lb (76.7 kg)   SpO2 99%   BMI 27.28 kg/m  Wt Readings from Last 3 Encounters:  09/15/18 169 lb (76.7 kg)  08/05/18 164 lb (74.4 kg)  05/31/18 175 lb (79.4 kg)     Health Maintenance Due  Topic Date Due  . HIV Screening  08/13/1986  . PAP SMEAR-Modifier  10/17/2018    There are no preventive care reminders to display for this patient.  Lab Results  Component Value Date   TSH 1.48 12/17/2017   Lab Results  Component Value Date   WBC 7.7 12/17/2017   HGB 14.5 12/17/2017    HCT 42.9 12/17/2017   MCV 88.6 12/17/2017   PLT 323 12/17/2017   Lab Results  Component Value Date   NA 139 12/17/2017   K 4.5 12/17/2017   CO2 28 12/17/2017  GLUCOSE 102 (H) 12/17/2017   BUN 18 12/17/2017   CREATININE 0.96 12/17/2017   BILITOT 0.5 12/17/2017   ALKPHOS 73 12/23/2013   AST 17 12/17/2017   ALT 14 12/17/2017   PROT 7.6 12/17/2017   ALBUMIN 4.8 12/23/2013   CALCIUM 9.9 12/17/2017   Lab Results  Component Value Date   CHOL 236 (H) 12/17/2017   Lab Results  Component Value Date   HDL 42 (L) 12/17/2017   Lab Results  Component Value Date   LDLCALC 144 (H) 12/17/2017   Lab Results  Component Value Date   TRIG 328 (H) 12/17/2017   Lab Results  Component Value Date   CHOLHDL 5.6 (H) 12/17/2017   Lab Results  Component Value Date   HGBA1C 5.6 12/15/2017      Assessment & Plan:   Problem List Items Addressed This Visit      Cardiovascular and Mediastinum   Persistent migraine aura without cerebral infarction and without status migrainosus, not intractable    Migraine headaches-she really has not had major issues with migraines since her early 60s.  We discussed giving her a rescue medication such as a triptan to use as needed and then also going ahead and putting her on prophylaxis since she also has some component of chronic daily headache as well.  We will start her on a low-dose of topiramate and then have her follow-up in about 4 weeks so that we can make adjustments to her regimen depending on how she is doing.      Relevant Medications   amLODipine (NORVASC) 5 MG tablet   eletriptan (RELPAX) 40 MG tablet   topiramate (TOPAMAX) 25 MG tablet   Hypertension goal BP (blood pressure) < 130/80 - Primary    Blood pressure here today blood pressure here today overall looks okay though the diastolic is a little higher than I would like to see it.  We can try to push the amlodipine to 10 mg but monitor for any ankle swelling.      Relevant Medications    amLODipine (NORVASC) 5 MG tablet     Other   Stress at work    She has met with her VP at work and has made it very clear that she needs some help and assistance at work.  She is at the point where she would definitely consider leaving if they do not get her some help.      Overweight (BMI 25.0-29.9)    We discussed graded exercise.  And monitor for any symptoms such as lightheadedness or dizziness or significant changes in blood pressure and gradually advance that regimen every couple of weeks.  If she still continuing to have problems with exercise then please let me know.         Meds ordered this encounter  Medications  . amLODipine (NORVASC) 5 MG tablet    Sig: Take 2 tablets (10 mg total) by mouth daily.    Dispense:  30 tablet    Refill:  1  . eletriptan (RELPAX) 40 MG tablet    Sig: Take 1 tablet (40 mg total) by mouth as needed for migraine or headache. May repeat in 2 hours if headache persists or recurs.    Dispense:  10 tablet    Refill:  0  . topiramate (TOPAMAX) 25 MG tablet    Sig: Take 1 tablet (25 mg total) by mouth at bedtime. After 3 days increase to BID.    Dispense:  60 tablet  Refill:  0    Follow-up: Return in about 4 months (around 01/15/2019) for migraines and BP.    Beatrice Lecher, MD

## 2018-09-15 NOTE — Patient Instructions (Signed)
We can increase you amlodipine to 10mg . If you notice ankle swelling on the 10mg  then ok to go back down to 5mg .

## 2018-09-15 NOTE — Assessment & Plan Note (Signed)
Blood pressure here today blood pressure here today overall looks okay though the diastolic is a little higher than I would like to see it.  We can try to push the amlodipine to 10 mg but monitor for any ankle swelling.

## 2018-09-15 NOTE — Assessment & Plan Note (Signed)
Migraine headaches-she really has not had major issues with migraines since her early 29s.  We discussed giving her a rescue medication such as a triptan to use as needed and then also going ahead and putting her on prophylaxis since she also has some component of chronic daily headache as well.  We will start her on a low-dose of topiramate and then have her follow-up in about 4 weeks so that we can make adjustments to her regimen depending on how she is doing.

## 2018-09-15 NOTE — Assessment & Plan Note (Signed)
She has met with her VP at work and has made it very clear that she needs some help and assistance at work.  She is at the point where she would definitely consider leaving if they do not get her some help.

## 2018-09-16 ENCOUNTER — Encounter: Payer: Self-pay | Admitting: Family Medicine

## 2018-09-16 ENCOUNTER — Other Ambulatory Visit: Payer: Self-pay | Admitting: *Deleted

## 2018-09-16 MED ORDER — ELETRIPTAN HYDROBROMIDE 40 MG PO TABS: 40.0000 mg | ORAL_TABLET | ORAL | 0 refills | Status: DC | PRN

## 2018-09-29 ENCOUNTER — Encounter: Payer: Self-pay | Admitting: Family Medicine

## 2018-10-08 ENCOUNTER — Other Ambulatory Visit: Payer: Self-pay | Admitting: Family Medicine

## 2018-10-15 ENCOUNTER — Encounter: Payer: Self-pay | Admitting: Family Medicine

## 2018-10-15 MED ORDER — AMLODIPINE BESYLATE 10 MG PO TABS: 10.0000 mg | ORAL_TABLET | Freq: Every day | ORAL | 1 refills | Status: DC

## 2018-10-15 NOTE — Telephone Encounter (Signed)
Okay [confusion.  I am to go ahead and increase her dose to 10 mg once a day instead of 5 mg twice a day.  New prescription sent for 90-day supply.

## 2018-10-17 ENCOUNTER — Other Ambulatory Visit: Payer: Self-pay | Admitting: Family Medicine

## 2018-11-16 ENCOUNTER — Encounter: Payer: Self-pay | Admitting: Family Medicine

## 2018-11-16 ENCOUNTER — Other Ambulatory Visit: Payer: Self-pay | Admitting: *Deleted

## 2018-11-16 MED ORDER — TOPIRAMATE 25 MG PO TABS: 25.0000 mg | ORAL_TABLET | Freq: Two times a day (BID) | ORAL | 0 refills | Status: DC

## 2019-01-03 ENCOUNTER — Telehealth: Payer: Self-pay | Admitting: Family Medicine

## 2019-01-03 ENCOUNTER — Other Ambulatory Visit: Payer: Self-pay

## 2019-01-03 ENCOUNTER — Ambulatory Visit (INDEPENDENT_AMBULATORY_CARE_PROVIDER_SITE_OTHER): Payer: BC Managed Care – PPO | Admitting: Family Medicine

## 2019-01-03 ENCOUNTER — Encounter: Payer: Self-pay | Admitting: Family Medicine

## 2019-01-03 VITALS — BP 111/78 | HR 91 | Temp 97.7°F | Ht 66.0 in | Wt 158.0 lb

## 2019-01-03 DIAGNOSIS — M545 Low back pain, unspecified: Secondary | ICD-10-CM

## 2019-01-03 DIAGNOSIS — N3000 Acute cystitis without hematuria: Secondary | ICD-10-CM | POA: Diagnosis not present

## 2019-01-03 DIAGNOSIS — Z0000 Encounter for general adult medical examination without abnormal findings: Secondary | ICD-10-CM

## 2019-01-03 DIAGNOSIS — Z1231 Encounter for screening mammogram for malignant neoplasm of breast: Secondary | ICD-10-CM

## 2019-01-03 DIAGNOSIS — Z23 Encounter for immunization: Secondary | ICD-10-CM

## 2019-01-03 DIAGNOSIS — T50905A Adverse effect of unspecified drugs, medicaments and biological substances, initial encounter: Secondary | ICD-10-CM

## 2019-01-03 DIAGNOSIS — R8290 Unspecified abnormal findings in urine: Secondary | ICD-10-CM | POA: Diagnosis not present

## 2019-01-03 LAB — POCT URINALYSIS DIPSTICK
Bilirubin, UA: NEGATIVE
Glucose, UA: NEGATIVE
Ketones, UA: NEGATIVE
Nitrite, UA: NEGATIVE
Protein, UA: POSITIVE — AB
Spec Grav, UA: 1.015 (ref 1.010–1.025)
Urobilinogen, UA: 1 E.U./dL
pH, UA: 7.5 (ref 5.0–8.0)

## 2019-01-03 MED ORDER — NITROFURANTOIN MONOHYD MACRO 100 MG PO CAPS: 100.0000 mg | ORAL_CAPSULE | Freq: Two times a day (BID) | ORAL | 0 refills | Status: DC

## 2019-01-03 NOTE — Telephone Encounter (Signed)
Plans on scheduling physical in the next 1 to 2 weeks.  Labs ordered.  She can go at her convenience.

## 2019-01-03 NOTE — Progress Notes (Signed)
Acute Office Visit  Subjective:    Patient ID: Pamela Stevens, female    DOB: 1970-05-10, 48 y.o.   MRN: [id number]  Chief Complaint  Patient presents with  . Dysuria    HPI Patient is in today for dysuria for about 5 days.  No significant pelvic pain or pressure.  But she has been having some bilateral low back pain with it as well.  No hematuria.  No fevers chills or sweats.  She has not tried anything over-the-counter.  She has had some frequency she says but she drinks over a gallon of water a day so she always goes frequently.  Noticed some unusual joint pain particularly in her knees and elbows and noticed that her restless leg syndrome was worse over the last 2 nights making it difficult to sleep.  She has lost about 20 pounds since June.  She is been doing intermittent fasting and says she has really cut back significantly on caffeine and soda and sugars.  She has felt great and has gone from a size 8 down to a size 4.  She also has a physical coming up and so wanted to get blood work beforehand.  She particularly wants to keep an eye on her A1c as she has had a prior history of impaired fasting glucose.  She also needs let me know that she thinks she may have had a reaction to clindamycin.  She says she was given the prescription for dental treatment.  She says about 4 days into the prescription she started losing significant amount of hair.  She says then she started to develop a rash around her groin.  She is been using Tinactin on it and says that it does seem to be helping it seems to be resolving.  And she had a lot of vaginal irritation and she used Vagisil for about 3 days and that improved on its own as well.   Past Medical History:  Diagnosis Date  . DIABETES MELLITUS, GESTATIONAL, INSULIN-DEPENDENT 03/05/2007   Qualifier: Diagnosis of  By: Thomos LemonsBowen DO, Karen    . Gestational diabetes   . Hypertension   . PCOS (polycystic ovarian syndrome)     Past Surgical  History:  Procedure Laterality Date  . NO PAST SURGERIES      Family History  Problem Relation Age of Onset  . Diabetes Mellitus II Mother   . Hypertension Mother   . Diabetes Mellitus II Father   . Hypercholesterolemia Father   . Hypertension Father   . Heart disease Father   . Hypertension Brother     Social History   Socioeconomic History  . Marital status: Married    Spouse name: Not on file  . Number of children: 2  . Years of education: Not on file  . Highest education level: Not on file  Occupational History  . 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 Financial resource strain: Not on file  . Food insecurity    Worry: Not on file    Inability: Not on file  . Transportation needs    Medical: Not on file    Non-medical: Not on file  Tobacco Use  . Smoking status: Never Smoker  . Smokeless tobacco: Never Used  Substance and Sexual Activity  . Alcohol use: Yes    Alcohol/week: 0.0 - 1.0 standard drinks  . Drug use: Not on file  . Sexual activity: Yes    Partners: Male  Lifestyle  .  Physical activity    Days per week: Not on file    Minutes per session: Not on file  . Stress: Not on file  Relationships  . Social Musician on phone: Not on file    Gets together: Not on file    Attends religious service: Not on file    Active member of club or organization: Not on file    Attends meetings of clubs or organizations: Not on file    Relationship status: Not on file  . Intimate partner violence    Fear of current or ex partner: Not on file    Emotionally abused: Not on file    Physically abused: Not on file    Forced sexual activity: Not on file  Other Topics Concern  . Not on file  Social History Narrative   Some exercising. 1-2 caffeine drinks per day.     Outpatient Medications Prior to Visit  Medication Sig Dispense Refill  . amLODipine (NORVASC) 10 MG tablet Take 1 tablet (10 mg total) by mouth daily. 90 tablet 1  .  eletriptan (RELPAX) 40 MG tablet Take 1 tablet (40 mg total) by mouth as needed for migraine or headache. May repeat in 2 hours if headache persists or recurs. 10 tablet 0  . hydrochlorothiazide (HYDRODIURIL) 25 MG tablet Take 1 tablet (25 mg total) by mouth daily. 90 tablet 1  . topiramate (TOPAMAX) 25 MG tablet Take 1 tablet (25 mg total) by mouth 2 (two) times daily. 60 tablet 0   No facility-administered medications prior to visit.     Allergies  Allergen Reactions  . Avapro [Irbesartan] Other (See Comments)    Hair Loss  . Clindamycin/Lincomycin Itching    Hair loss  . Lisinopril Cough    ROS     Objective:    Physical Exam  Constitutional: She is oriented to person, place, and time. She appears well-developed and well-nourished.  HENT:  Head: Normocephalic and atraumatic.  Cardiovascular: Normal rate, regular rhythm and normal heart sounds.  Pulmonary/Chest: Effort normal and breath sounds normal.  Musculoskeletal:     Comments: MR spine is nontender.  Normal flexion extension rotation and side bending.  Negative straight leg raise.  Hip, knee, ankle strength is 5-5 bilaterally.  Neurological: She is alert and oriented to person, place, and time.  Skin: Skin is warm and dry.  Psychiatric: She has a normal mood and affect. Her behavior is normal.    BP 111/78   Pulse 91   Temp 97.7 F (36.5 C) (Oral)   Ht 5\' 6"  (1.676 m)   Wt 158 lb (71.7 kg)   SpO2 100%   BMI 25.50 kg/m  Wt Readings from Last 3 Encounters:  01/03/19 158 lb (71.7 kg)  09/15/18 169 lb (76.7 kg)  08/05/18 164 lb (74.4 kg)    Health Maintenance Due  Topic Date Due  . HIV Screening  08/13/1986  . MAMMOGRAM  11/21/2018    There are no preventive care reminders to display for this patient.   Lab Results  Component Value Date   TSH 1.48 12/17/2017   Lab Results  Component Value Date   WBC 7.7 12/17/2017   HGB 14.5 12/17/2017   HCT 42.9 12/17/2017   MCV 88.6 12/17/2017   PLT 323  12/17/2017   Lab Results  Component Value Date   NA 139 12/17/2017   K 4.5 12/17/2017   CO2 28 12/17/2017   GLUCOSE 102 (H) 12/17/2017  BUN 18 12/17/2017   CREATININE 0.96 12/17/2017   BILITOT 0.5 12/17/2017   ALKPHOS 73 12/23/2013   AST 17 12/17/2017   ALT 14 12/17/2017   PROT 7.6 12/17/2017   ALBUMIN 4.8 12/23/2013   CALCIUM 9.9 12/17/2017   Lab Results  Component Value Date   CHOL 236 (H) 12/17/2017   Lab Results  Component Value Date   HDL 42 (L) 12/17/2017   Lab Results  Component Value Date   LDLCALC 144 (H) 12/17/2017   Lab Results  Component Value Date   TRIG 328 (H) 12/17/2017   Lab Results  Component Value Date   CHOLHDL 5.6 (H) 12/17/2017   Lab Results  Component Value Date   HGBA1C 5.6 12/15/2017       Assessment & Plan:   Problem List Items Addressed This Visit    None    Visit Diagnoses    Cloudy urine    -  Primary   Relevant Orders   POCT Urinalysis Dipstick (Completed)   Urinalysis, microscopic only   Encounter for screening mammogram for malignant neoplasm of breast       Relevant Orders   MM 3D SCREEN BREAST BILATERAL   Needs flu shot       Acute cystitis without hematuria       Relevant Medications   nitrofurantoin, macrocrystal-monohydrate, (MACROBID) 100 MG capsule   Medication side effect, initial encounter       Acute bilateral low back pain without sciatica         UTI-urinalysis is positive for leukocytes protein and blood.  We will go ahead and treat with Macrobid.  Medication side effect-we will add clindamycin to her intolerance list.  Low back pain-likely related to UTI but if it is not improving then suspect musculoskeletal cause.  Meds ordered this encounter  Medications  . nitrofurantoin, macrocrystal-monohydrate, (MACROBID) 100 MG capsule    Sig: Take 1 capsule (100 mg total) by mouth 2 (two) times daily.    Dispense:  10 capsule    Refill:  0     Beatrice Lecher, MD

## 2019-01-04 ENCOUNTER — Other Ambulatory Visit: Payer: Self-pay | Admitting: *Deleted

## 2019-01-04 DIAGNOSIS — R319 Hematuria, unspecified: Secondary | ICD-10-CM

## 2019-01-04 LAB — URINE CULTURE

## 2019-01-04 LAB — URINALYSIS, MICROSCOPIC ONLY
Hyaline Cast: NONE SEEN /LPF
Squamous Epithelial / HPF: NONE SEEN /HPF (ref <=5)
WBC, UA: >=60 /HPF — AB (ref 0–5)

## 2019-01-07 DIAGNOSIS — Z0000 Encounter for general adult medical examination without abnormal findings: Secondary | ICD-10-CM | POA: Diagnosis not present

## 2019-01-10 ENCOUNTER — Other Ambulatory Visit: Payer: Self-pay

## 2019-01-10 ENCOUNTER — Ambulatory Visit (INDEPENDENT_AMBULATORY_CARE_PROVIDER_SITE_OTHER): Payer: BC Managed Care – PPO | Admitting: Family Medicine

## 2019-01-10 ENCOUNTER — Encounter: Payer: Self-pay | Admitting: Family Medicine

## 2019-01-10 VITALS — BP 115/78 | HR 77 | Ht 66.0 in | Wt 152.0 lb

## 2019-01-10 DIAGNOSIS — Z0000 Encounter for general adult medical examination without abnormal findings: Secondary | ICD-10-CM

## 2019-01-10 DIAGNOSIS — D582 Other hemoglobinopathies: Secondary | ICD-10-CM | POA: Diagnosis not present

## 2019-01-10 DIAGNOSIS — R7301 Impaired fasting glucose: Secondary | ICD-10-CM

## 2019-01-10 DIAGNOSIS — Z1231 Encounter for screening mammogram for malignant neoplasm of breast: Secondary | ICD-10-CM

## 2019-01-10 DIAGNOSIS — R0789 Other chest pain: Secondary | ICD-10-CM | POA: Diagnosis not present

## 2019-01-10 DIAGNOSIS — E785 Hyperlipidemia, unspecified: Secondary | ICD-10-CM | POA: Insufficient documentation

## 2019-01-10 MED ORDER — TOPIRAMATE 25 MG PO TABS: 25.0000 mg | ORAL_TABLET | Freq: Two times a day (BID) | ORAL | 1 refills | Status: DC

## 2019-01-10 MED ORDER — ELETRIPTAN HYDROBROMIDE 40 MG PO TABS: 40.0000 mg | ORAL_TABLET | ORAL | 99 refills | Status: DC | PRN

## 2019-01-10 NOTE — Patient Instructions (Signed)
Health Maintenance, Female Adopting a healthy lifestyle and getting preventive care are important in promoting health and wellness. Ask your health care provider about:  The right schedule for you to have regular tests and exams.  Things you can do on your own to prevent diseases and keep yourself healthy. What should I know about diet, weight, and exercise? Eat a healthy diet   Eat a diet that includes plenty of vegetables, fruits, low-fat dairy products, and lean protein.  Do not eat a lot of foods that are high in solid fats, added sugars, or sodium. Maintain a healthy weight Body mass index (BMI) is used to identify weight problems. It estimates body fat based on height and weight. Your health care provider can help determine your BMI and help you achieve or maintain a healthy weight. Get regular exercise Get regular exercise. This is one of the most important things you can do for your health. Most adults should:  Exercise for at least 150 minutes each week. The exercise should increase your heart rate and make you sweat (moderate-intensity exercise).  Do strengthening exercises at least twice a week. This is in addition to the moderate-intensity exercise.  Spend less time sitting. Even light physical activity can be beneficial. Watch cholesterol and blood lipids Have your blood tested for lipids and cholesterol at 47 years of age, then have this test every 5 years. Have your cholesterol levels checked more often if:  Your lipid or cholesterol levels are high.  You are older than 47 years of age.  You are at high risk for heart disease. What should I know about cancer screening? Depending on your health history and family history, you may need to have cancer screening at various ages. This may include screening for:  Breast cancer.  Cervical cancer.  Colorectal cancer.  Skin cancer.  Lung cancer. What should I know about heart disease, diabetes, and high blood  pressure? Blood pressure and heart disease  High blood pressure causes heart disease and increases the risk of stroke. This is more likely to develop in people who have high blood pressure readings, are of African descent, or are overweight.  Have your blood pressure checked: ? Every 3-5 years if you are 18-39 years of age. ? Every year if you are 40 years old or older. Diabetes Have regular diabetes screenings. This checks your fasting blood sugar level. Have the screening done:  Once every three years after age 40 if you are at a normal weight and have a low risk for diabetes.  More often and at a younger age if you are overweight or have a high risk for diabetes. What should I know about preventing infection? Hepatitis B If you have a higher risk for hepatitis B, you should be screened for this virus. Talk with your health care provider to find out if you are at risk for hepatitis B infection. Hepatitis C Testing is recommended for:  Everyone born from 1945 through 1965.  Anyone with known risk factors for hepatitis C. Sexually transmitted infections (STIs)  Get screened for STIs, including gonorrhea and chlamydia, if: ? You are sexually active and are younger than 47 years of age. ? You are older than 47 years of age and your health care provider tells you that you are at risk for this type of infection. ? Your sexual activity has changed since you were last screened, and you are at increased risk for chlamydia or gonorrhea. Ask your health care provider if   you are at risk.  Ask your health care provider about whether you are at high risk for HIV. Your health care provider may recommend a prescription medicine to help prevent HIV infection. If you choose to take medicine to prevent HIV, you should first get tested for HIV. You should then be tested every 3 months for as long as you are taking the medicine. Pregnancy  If you are about to stop having your period (premenopausal) and  you may become pregnant, seek counseling before you get pregnant.  Take 400 to 800 micrograms (mcg) of folic acid every day if you become pregnant.  Ask for birth control (contraception) if you want to prevent pregnancy. Osteoporosis and menopause Osteoporosis is a disease in which the bones lose minerals and strength with aging. This can result in bone fractures. If you are 65 years old or older, or if you are at risk for osteoporosis and fractures, ask your health care provider if you should:  Be screened for bone loss.  Take a calcium or vitamin D supplement to lower your risk of fractures.  Be given hormone replacement therapy (HRT) to treat symptoms of menopause. Follow these instructions at home: Lifestyle  Do not use any products that contain nicotine or tobacco, such as cigarettes, e-cigarettes, and chewing tobacco. If you need help quitting, ask your health care provider.  Do not use street drugs.  Do not share needles.  Ask your health care provider for help if you need support or information about quitting drugs. Alcohol use  Do not drink alcohol if: ? Your health care provider tells you not to drink. ? You are pregnant, may be pregnant, or are planning to become pregnant.  If you drink alcohol: ? Limit how much you use to 0-1 drink a day. ? Limit intake if you are breastfeeding.  Be aware of how much alcohol is in your drink. In the [place] one drink equals one 12 oz bottle of beer (355 mL), one 5 oz glass of wine (148 mL), or one 1 oz glass of hard liquor (44 mL). General instructions  Schedule regular health, dental, and eye exams.  Stay current with your vaccines.  Tell your health care provider if: ? You often feel depressed. ? You have ever been abused or do not feel safe at home. Summary  Adopting a healthy lifestyle and getting preventive care are important in promoting health and wellness.  Follow your health care provider's instructions about healthy  diet, exercising, and getting tested or screened for diseases.  Follow your health care provider's instructions on monitoring your cholesterol and blood pressure. This information is not intended to replace advice given to you by your health care provider. Make sure you discuss any questions you have with your health care provider. Document Released: 09/23/2010 Document Revised: 03/03/2018 Document Reviewed: 03/03/2018 Elsevier Patient Education  2020 Else[employer] Inc[employer]  

## 2019-01-10 NOTE — Progress Notes (Signed)
Subjective:     Pamela Stevens is a 48 y.o. female and is here for a comprehensive physical exam. The patient reports no problems.  Actually been working really hard over the last year to lose weight.  She has not been exercising as consistently as she would like.  She is really changed her diet significantly and cut out a lot of carbs and processed foods and sweets.  More recently over the last 4 to 6 weeks she has been complaining of left upper chest pain that is sharp and intermittent.  It usually happens about 2-3 times per week.  She says sometimes it feels like an ice pick in her chest and sometimes it feels a little bit more wide a most like a hammer hitting her chest.  She says when it happens it takes her breath away because of that intense.  Usually last anywhere from 60 seconds to 5 minutes.  She has not found any specific triggers or alleviating factors.  Social History   Socioeconomic History  . Marital status: Married    Spouse name: Not on file  . Number of children: 2  . Years of education: Not on file  . Highest education level: Not on file  Occupational History  . Occupation: Engineer, maintenance (IT)  Social Needs  . Financial resource strain: Not on file  . Food insecurity    Worry: Not on file    Inability: Not on file  . Transportation needs    Medical: Not on file    Non-medical: Not on file  Tobacco Use  . Smoking status: Never Smoker  . Smokeless tobacco: Never Used  Substance and Sexual Activity  . Alcohol use: Yes    Alcohol/week: 0.0 - 1.0 standard drinks  . Drug use: Not on file  . Sexual activity: Yes    Partners: Male  Lifestyle  . Physical activity    Days per week: Not on file    Minutes per session: Not on file  . Stress: Not on file  Relationships  . Social Herbalist on phone: Not on file    Gets together: Not on file    Attends religious service: Not on file    Active member of club or organization: Not on file   Attends meetings of clubs or organizations: Not on file    Relationship status: Not on file  . Intimate partner violence    Fear of current or ex partner: Not on file    Emotionally abused: Not on file    Physically abused: Not on file    Forced sexual activity: Not on file  Other Topics Concern  . Not on file  Social History Narrative   Some exercising. 1-2 caffeine drinks per day.    Health Maintenance  Topic Date Due  . MAMMOGRAM  11/21/2018  . INFLUENZA VACCINE  06/22/2019 (Originally 10/23/2018)  . HIV Screening  01/10/2020 (Originally 08/13/1986)  . PAP SMEAR-Modifier  10/16/2020  . TETANUS/TDAP  04/11/2027    The following portions of the patient's history were reviewed and updated as appropriate: allergies, current medications, past family history, past medical history, past social history, past surgical history and problem list.  Review of Systems A comprehensive review of systems was negative.   Objective:    BP 115/78   Pulse 77   Ht 5\' 6"  (1.676 m)   Wt 152 lb (68.9 kg)   LMP 12/16/2018 (Exact Date)   SpO2 100%  BMI 24.53 kg/m  General appearance: alert, cooperative and appears stated age Head: Normocephalic, without obvious abnormality, atraumatic Eyes: conj clear, EOMI, PEERLA Ears: normal TM's and external ear canals both ears Nose: Nares normal. Septum midline. Mucosa normal. No drainage or sinus tenderness. Throat: lips, mucosa, and tongue normal; teeth and gums normal Neck: no adenopathy, no carotid bruit, no JVD, supple, symmetrical, trachea midline and thyroid not enlarged, symmetric, no tenderness/mass/nodules Back: symmetric, no curvature. ROM normal. No CVA tenderness. Lungs: clear to auscultation bilaterally Breasts: normal appearance, no masses or tenderness Heart: regular rate and rhythm, S1, S2 normal, no murmur, click, rub or gallop Abdomen: soft, non-tender; bowel sounds normal; no masses,  no organomegaly Extremities: extremities normal,  atraumatic, no cyanosis or edema Pulses: 2+ and symmetric Skin: Skin color, texture, turgor normal. No rashes or lesions Lymph nodes: Cervical, supraclavicular, and axillary nodes normal. Neurologic: Alert and oriented X 3, normal strength and tone. Normal symmetric reflexes. Normal coordination and gait    Assessment:    Healthy female exam.     Plan:     See After Visit Summary for Counseling Recommendations   Keep up a regular exercise program and make sure you are eating a healthy diet Try to eat 4 servings of dairy a day, or if you are lactose intolerant take a calcium with vitamin D daily.  Your vaccines are up to date.  Sure to schedule her mammogram.  Atypical chest pain-unclear etiology.  Pain does not occur with activity or exercise which is reassuring that is probably not cardiac it is probably musculoskeletal.  Recent labs show a normal thyroid level.  No anemia but in fact her hemoglobin levels are slightly elevated.  Cholesterol is elevated as well.  No electrolyte abnormalities.  We will do an EKG today for further work-up.  EKG today shows normal sinus rhythm with rate of 75 bpm.  No acute ST-T wave changes.  We will keep an eye on this over the next couple weeks.  Encouraged her to work on posture.  She sits at a computer for prolonged periods during work and see if this makes a difference if it continues then we can work-up further if needed.  We did review the labs together.  She did have an elevated hemoglobin which was a little bit unusual she is not a smoker and feels like she hydrates purposely really well daily.  So we plan to repeat the hemoglobin in about 2 weeks with a peripheral smear.  Have never had any suspicion for any type of positive polycythemia vera before.

## 2019-01-10 NOTE — Assessment & Plan Note (Signed)
Hemoglobin A1c looks fantastic this time.

## 2019-01-13 LAB — COMPLETE METABOLIC PANEL WITH GFR
AG Ratio: 1.8 (calc) (ref 1.0–2.5)
ALT: 11 U/L (ref 6–29)
AST: 15 U/L (ref 10–35)
Albumin: 5 g/dL (ref 3.6–5.1)
Alkaline phosphatase (APISO): 67 U/L (ref 31–125)
BUN: 14 mg/dL (ref 7–25)
CO2: 25 mmol/L (ref 20–32)
Calcium: 10.1 mg/dL (ref 8.6–10.2)
Chloride: 97 mmol/L — ABNORMAL LOW (ref 98–110)
Creat: 0.96 mg/dL (ref 0.50–1.10)
GFR, Est African American: 82 mL/min/{1.73_m2} (ref >=60)
GFR, Est Non African American: 70 mL/min/{1.73_m2} (ref >=60)
Globulin: 2.8 g/dL (calc) (ref 1.9–3.7)
Glucose, Bld: 103 mg/dL — ABNORMAL HIGH (ref 65–99)
Potassium: 3.5 mmol/L (ref 3.5–5.3)
Sodium: 138 mmol/L (ref 135–146)
Total Bilirubin: 0.8 mg/dL (ref 0.2–1.2)
Total Protein: 7.8 g/dL (ref 6.1–8.1)

## 2019-01-13 LAB — HEMOGLOBIN A1C
Hgb A1c MFr Bld: 5.5 % of total Hgb (ref <5.7)
Mean Plasma Glucose: 111 (calc)
eAG (mmol/L): 6.2 (calc)

## 2019-01-13 LAB — LIPID PANEL
Cholesterol: 238 mg/dL — ABNORMAL HIGH (ref <200)
HDL: 43 mg/dL — ABNORMAL LOW (ref >=50)
LDL Cholesterol (Calc): 161 mg/dL (calc) — ABNORMAL HIGH
Non-HDL Cholesterol (Calc): 195 mg/dL (calc) — ABNORMAL HIGH (ref <130)
Total CHOL/HDL Ratio: 5.5 (calc) — ABNORMAL HIGH (ref <5.0)
Triglycerides: 182 mg/dL — ABNORMAL HIGH (ref <150)

## 2019-01-13 LAB — CBC
HCT: 46.6 % — ABNORMAL HIGH (ref 35.0–45.0)
Hemoglobin: 15.7 g/dL — ABNORMAL HIGH (ref 11.7–15.5)
MCH: 29.5 pg (ref 27.0–33.0)
MCHC: 33.7 g/dL (ref 32.0–36.0)
MCV: 87.6 fL (ref 80.0–100.0)
MPV: 10.2 fL (ref 7.5–12.5)
Platelets: 398 10*3/uL (ref 140–400)
RBC: 5.32 10*6/uL — ABNORMAL HIGH (ref 3.80–5.10)
RDW: 13.1 % (ref 11.0–15.0)
WBC: 7.7 10*3/uL (ref 3.8–10.8)

## 2019-01-13 LAB — INSULIN, FREE (BIOACTIVE): Insulin, Free: 12 u[IU]/mL (ref 1.5–14.9)

## 2019-01-13 LAB — TSH: TSH: 1.82 mIU/L

## 2019-01-13 LAB — C-PEPTIDE: C-Peptide: 2.45 ng/mL (ref 0.80–3.85)

## 2019-01-17 ENCOUNTER — Ambulatory Visit: Payer: BC Managed Care – PPO | Admitting: Family Medicine

## 2019-01-19 NOTE — Addendum Note (Signed)
Addended by: Teddy Spike on: 01/19/2019 08:15 AM   Modules accepted: Orders

## 2019-02-04 DIAGNOSIS — R319 Hematuria, unspecified: Secondary | ICD-10-CM | POA: Diagnosis not present

## 2019-02-04 DIAGNOSIS — D582 Other hemoglobinopathies: Secondary | ICD-10-CM | POA: Diagnosis not present

## 2019-02-05 LAB — URINALYSIS, MICROSCOPIC ONLY
Bacteria, UA: NONE SEEN /HPF
Hyaline Cast: NONE SEEN /LPF
RBC / HPF: NONE SEEN /HPF (ref 0–2)

## 2019-02-07 LAB — PATHOLOGIST SMEAR REVIEW

## 2019-02-07 LAB — CBC WITH DIFFERENTIAL/PLATELET
Absolute Monocytes: 585 cells/uL (ref 200–950)
Basophils Absolute: 39 cells/uL (ref 0–200)
Basophils Relative: 0.5 %
Eosinophils Absolute: 92 cells/uL (ref 15–500)
Eosinophils Relative: 1.2 %
HCT: 45.6 % — ABNORMAL HIGH (ref 35.0–45.0)
Hemoglobin: 15.3 g/dL (ref 11.7–15.5)
Lymphs Abs: 2349 cells/uL (ref 850–3900)
MCH: 29.5 pg (ref 27.0–33.0)
MCHC: 33.6 g/dL (ref 32.0–36.0)
MCV: 87.9 fL (ref 80.0–100.0)
MPV: 10 fL (ref 7.5–12.5)
Monocytes Relative: 7.6 %
Neutro Abs: 4635 cells/uL (ref 1500–7800)
Neutrophils Relative %: 60.2 %
Platelets: 373 10*3/uL (ref 140–400)
RBC: 5.19 10*6/uL — ABNORMAL HIGH (ref 3.80–5.10)
RDW: 13 % (ref 11.0–15.0)
Total Lymphocyte: 30.5 %
WBC: 7.7 10*3/uL (ref 3.8–10.8)

## 2019-03-10 ENCOUNTER — Other Ambulatory Visit: Payer: Self-pay | Admitting: Family Medicine

## 2019-03-11 ENCOUNTER — Other Ambulatory Visit: Payer: Self-pay | Admitting: Family Medicine

## 2019-03-27 ENCOUNTER — Other Ambulatory Visit: Payer: Self-pay | Admitting: Family Medicine

## 2019-04-10 ENCOUNTER — Other Ambulatory Visit: Payer: Self-pay | Admitting: Family Medicine

## 2019-08-31 ENCOUNTER — Encounter: Payer: Self-pay | Admitting: Osteopathic Medicine

## 2019-08-31 ENCOUNTER — Telehealth (INDEPENDENT_AMBULATORY_CARE_PROVIDER_SITE_OTHER): Payer: BC Managed Care – PPO | Admitting: Osteopathic Medicine

## 2019-08-31 ENCOUNTER — Other Ambulatory Visit: Payer: Self-pay

## 2019-08-31 VITALS — Temp 97.8°F | Wt 142.0 lb

## 2019-08-31 DIAGNOSIS — J069 Acute upper respiratory infection, unspecified: Secondary | ICD-10-CM

## 2019-08-31 MED ORDER — PREDNISONE 20 MG PO TABS: 20.0000 mg | ORAL_TABLET | Freq: Two times a day (BID) | ORAL | 0 refills | Status: DC

## 2019-08-31 MED ORDER — IPRATROPIUM BROMIDE 0.06 % NA SOLN: 2.0000 | Freq: Four times a day (QID) | NASAL | 1 refills | Status: DC

## 2019-08-31 MED ORDER — AMOXICILLIN-POT CLAVULANATE 875-125 MG PO TABS: 1.0000 | ORAL_TABLET | Freq: Two times a day (BID) | ORAL | 0 refills | Status: DC

## 2019-08-31 MED ORDER — HYDROCODONE-HOMATROPINE 5-1.5 MG/5ML PO SYRP
5.0000 mL | ORAL_SOLUTION | Freq: Three times a day (TID) | ORAL | 0 refills | Status: DC | PRN
Start: 2019-08-31 — End: 2020-03-22

## 2019-08-31 NOTE — Patient Instructions (Signed)
Over-the-Counter Medications & Home Remedies for Respiratory Illness  Note: the following list assumes no pregnancy, normal liver & kidney function and no other drug interactions. Dr. Lyn Hollingshead has highlighted medications which are safe for you to use, but these may not be appropriate for everyone. Always ask a pharmacist or qualified medical provider if you have any questions!   Aches/Pains, Fever, Headache Acetaminophen (Tylenol) 500 mg tablets - take max 2 tablets (1000 mg) every 6 hours (4 times per day)  Ibuprofen (Motrin) 200 mg tablets - take max 4 tablets (800 mg) every 6 hours*  Sinus Congestion Prescription Atrovent as directed Cromolyn Nasal Spray (NasalCrom) 1 spray each nostril 3-4 times per day, max 6 imes per day Nasal Saline if desired Oxymetolazone (Afrin, others) sparing use due to rebound congestion, NEVER use in kids Phenylephrine (Sudafed) 10 mg tablets every 4 hours (or the 12-hour formulation)* Diphenhydramine (Benadryl) 25 mg tablets - take max 2 tablets every 4 hours  Cough & Sore Throat Prescription cough pills or syrups as directed Dextromethorphan (Robitussin, others) - cough suppressant Guaifenesin (Robitussin, Mucinex, others) - expectorant (helps cough up mucus) (Dextromethorphan and Guaifenesin also come in a combination tablet) Lozenges w/ Benzocaine + Menthol (Cepacol) Honey - as much as you want! Teas which "coat the throat" - look for ingredients Elm Bark, Licorice Root, Marshmallow Root  Other Zinc Lozenges within 24 hours of symptoms onset - mixed evidence this shortens the duration of the common cold Don't waste your money on Vitamin C or Echinacea  *Caution in patients with high blood pressure    PRESCRIPTIONS  Meds ordered this encounter  Medications  . Cough syrup HYDROcodone-homatropine (HYCODAN) 5-1.5 MG/5ML syrup    Sig: Take 5 mLs by mouth every 8 (eight) hours as needed for cough.    Dispense:  120 mL    Refill:  0  . Steroids -  reduce inflammation and cough predniSONE (DELTASONE) 20 MG tablet    Sig: Take 1 tablet (20 mg total) by mouth 2 (two) times daily with a meal.    Dispense:  10 tablet    Refill:  0  . Nasal spray - open air passages to avoid bacterial sinus infection ipratropium (ATROVENT) 0.06 % nasal spray    Sig: Place 2 sprays into both nostrils 4 (four) times daily. Prn sinus congestion    Dispense:  15 mL    Refill:  1  . Antibiotics ONLY IF NEEDED for sinusitis IF COUGH, SHORT OF BREATH OR FEVER - CALL us!!!! amoxicillin-clavulanate (AUGMENTIN) 875-125 MG tablet    Sig: Take 1 tablet by mouth 2 (two) times daily x5 days. FILL AND TAKE ONLY IF SEVERE NASAL CONGESTION DEVELOPS    Dispense:  10 tablet    Refill:  0    Expires 09/07/19

## 2019-08-31 NOTE — Progress Notes (Signed)
Virtual Visit via Video (App used: Doximity) Note  I connected with      Pamela Stevens on 08/31/19 at 10:15 AM  by a telemedicine application and verified that I am speaking with the correct person using two identifiers.  Patient is at home I am in office   I discussed the limitations of evaluation and management by telemedicine and the availability of in person appointments. The patient expressed understanding and agreed to proceed.  History of Present Illness: Pamela Stevens is a 49 y.o. female who would like to discuss illness - cough/congestion, fever. Last Weds sore throat, developed into hacking cough. Thurs/Fri in bed d/t cough and fatigue, fever/chills. Congestions and postnasal drip. So far has tried NyQuil. DayQuil causes some loopiness. Cough is worse concern at this point. Some sinus pressure     Observations/Objective: Temp 97.8 F (36.6 C)   Wt 142 lb (64.4 kg)   LMP 08/17/2019   BMI 22.92 kg/m  BP Readings from Last 3 Encounters:  01/10/19 115/78  01/03/19 111/78  09/15/18 118/85   Exam: Normal Speech.  NAD Mild coughing  Lab and Radiology Results No results found for this or any previous visit (from the past 72 hour(s)). No results found.     Assessment and Plan: 49 y.o. female with The encounter diagnosis was Viral URI with cough.   PDMP not reviewed this encounter. No orders of the defined types were placed in this encounter.  Patient Instructions   Over-the-Counter Medications & Home Remedies for Respiratory Illness  Note: the following list assumes no pregnancy, normal liver & kidney function and no other drug interactions. Dr. Lyn Hollingshead has highlighted medications which are safe for you to use, but these may not be appropriate for everyone. Always ask a pharmacist or qualified medical provider if you have any questions!   Aches/Pains, Fever, Headache Acetaminophen (Tylenol) 500 mg tablets - take max 2 tablets (1000 mg)  every 6 hours (4 times per day)  Ibuprofen (Motrin) 200 mg tablets - take max 4 tablets (800 mg) every 6 hours*  Sinus Congestion Prescription Atrovent as directed Cromolyn Nasal Spray (NasalCrom) 1 spray each nostril 3-4 times per day, max 6 imes per day Nasal Saline if desired Oxymetolazone (Afrin, others) sparing use due to rebound congestion, NEVER use in kids Phenylephrine (Sudafed) 10 mg tablets every 4 hours (or the 12-hour formulation)* Diphenhydramine (Benadryl) 25 mg tablets - take max 2 tablets every 4 hours  Cough & Sore Throat Prescription cough pills or syrups as directed Dextromethorphan (Robitussin, others) - cough suppressant Guaifenesin (Robitussin, Mucinex, others) - expectorant (helps cough up mucus) (Dextromethorphan and Guaifenesin also come in a combination tablet) Lozenges w/ Benzocaine + Menthol (Cepacol) Honey - as much as you want! Teas which "coat the throat" - look for ingredients Elm Bark, Licorice Root, Marshmallow Root  Other Zinc Lozenges within 24 hours of symptoms onset - mixed evidence this shortens the duration of the common cold Don't waste your money on Vitamin C or Echinacea  *Caution in patients with high blood pressure    PRESCRIPTIONS  Meds ordered this encounter  Medications  . Cough syrup HYDROcodone-homatropine (HYCODAN) 5-1.5 MG/5ML syrup    Sig: Take 5 mLs by mouth every 8 (eight) hours as needed for cough.    Dispense:  120 mL    Refill:  0  . Steroids - reduce inflammation and cough predniSONE (DELTASONE) 20 MG tablet    Sig: Take 1 tablet (20 mg total) by  mouth 2 (two) times daily with a meal.    Dispense:  10 tablet    Refill:  0  . Nasal spray - open air passages to avoid bacterial sinus infection ipratropium (ATROVENT) 0.06 % nasal spray    Sig: Place 2 sprays into both nostrils 4 (four) times daily. Prn sinus congestion    Dispense:  15 mL    Refill:  1  . Antibiotics ONLY IF NEEDED for sinusitis IF COUGH, SHORT OF  BREATH OR FEVER - CALL us!!!! amoxicillin-clavulanate (AUGMENTIN) 875-125 MG tablet    Sig: Take 1 tablet by mouth 2 (two) times daily x5 days. FILL AND TAKE ONLY IF SEVERE NASAL CONGESTION DEVELOPS    Dispense:  10 tablet    Refill:  0    Expires 09/07/19        Instructions sent via MyChart. If MyChart not available, pt was given option for info via personal e-mail w/ no guarantee of protected health info over unsecured e-mail communication, and MyChart sign-up instructions were sent to patient.   Follow Up Instructions: Return if symptoms worsen or fail to improve.    I discussed the assessment and treatment plan with the patient. The patient was provided an opportunity to ask questions and all were answered. The patient agreed with the plan and demonstrated an understanding of the instructions.   The patient was advised to call back or seek an in-person evaluation if any new concerns, if symptoms worsen or if the condition fails to improve as anticipated.  30 minutes of non-face-to-face time was provided during this encounter.      . . . . . . . . . . . . . Marland Kitchen                   Historical information moved to improve visibility of documentation.  Past Medical History:  Diagnosis Date  . DIABETES MELLITUS, GESTATIONAL, INSULIN-DEPENDENT 03/05/2007   Qualifier: Diagnosis of  By: Esmeralda Arthur    . Gestational diabetes   . Hypertension   . PCOS (polycystic ovarian syndrome)    Past Surgical History:  Procedure Laterality Date  . NO PAST SURGERIES     Social History   Tobacco Use  . Smoking status: Never Smoker  . Smokeless tobacco: Never Used  Substance Use Topics  . Alcohol use: Yes    Alcohol/week: 0.0 - 1.0 standard drinks   family history includes Diabetes Mellitus II in her father and mother; Heart disease in her father; Hypercholesterolemia in her father; Hypertension in her brother, father, and mother.  Medications: Current  Outpatient Medications  Medication Sig Dispense Refill  . amLODipine (NORVASC) 10 MG tablet TAKE 1 TABLET BY MOUTH EVERY DAY 90 tablet 1  . hydrochlorothiazide (HYDRODIURIL) 25 MG tablet TAKE 1 TABLET BY MOUTH EVERY DAY 90 tablet 1  . topiramate (TOPAMAX) 25 MG tablet Take 1 tablet (25 mg total) by mouth 2 (two) times daily. 180 tablet 1  . amoxicillin-clavulanate (AUGMENTIN) 875-125 MG tablet Take 1 tablet by mouth 2 (two) times daily. FILL AND TAKE ONLY IF SEVERE NASAL CONGESTION DEVELOPS 10 tablet 0  . HYDROcodone-homatropine (HYCODAN) 5-1.5 MG/5ML syrup Take 5 mLs by mouth every 8 (eight) hours as needed for cough. 120 mL 0  . ipratropium (ATROVENT) 0.06 % nasal spray Place 2 sprays into both nostrils 4 (four) times daily. Prn sinus congestion 15 mL 1  . predniSONE (DELTASONE) 20 MG tablet Take 1 tablet (20 mg total) by mouth 2 (  two) times daily with a meal. 10 tablet 0   No current facility-administered medications for this visit.   Allergies  Allergen Reactions  . Avapro [Irbesartan] Other (See Comments)    Hair Loss  . Clindamycin/Lincomycin Itching    Hair loss  . Lisinopril Cough

## 2019-09-05 ENCOUNTER — Other Ambulatory Visit: Payer: Self-pay | Admitting: Family Medicine

## 2019-09-08 ENCOUNTER — Telehealth: Payer: BC Managed Care – PPO | Admitting: Osteopathic Medicine

## 2019-10-15 ENCOUNTER — Other Ambulatory Visit: Payer: Self-pay | Admitting: Family Medicine

## 2019-11-29 DIAGNOSIS — L209 Atopic dermatitis, unspecified: Secondary | ICD-10-CM | POA: Diagnosis not present

## 2019-11-29 DIAGNOSIS — B351 Tinea unguium: Secondary | ICD-10-CM | POA: Diagnosis not present

## 2019-11-29 DIAGNOSIS — L700 Acne vulgaris: Secondary | ICD-10-CM | POA: Diagnosis not present

## 2020-01-14 ENCOUNTER — Other Ambulatory Visit: Payer: Self-pay | Admitting: Family Medicine

## 2020-02-14 ENCOUNTER — Other Ambulatory Visit: Payer: Self-pay | Admitting: Family Medicine

## 2020-03-05 ENCOUNTER — Other Ambulatory Visit: Payer: Self-pay | Admitting: Family Medicine

## 2020-03-06 NOTE — Telephone Encounter (Signed)
Please call pt and get her scheduled for f/u for bp, medication refill, and fasting labs.

## 2020-03-07 NOTE — Telephone Encounter (Signed)
LVM to get this appt scheduled. AM °

## 2020-03-17 ENCOUNTER — Other Ambulatory Visit: Payer: Self-pay | Admitting: Family Medicine

## 2020-03-21 ENCOUNTER — Ambulatory Visit: Payer: BC Managed Care – PPO | Admitting: Family Medicine

## 2020-03-22 ENCOUNTER — Encounter: Payer: Self-pay | Admitting: Family Medicine

## 2020-03-22 ENCOUNTER — Other Ambulatory Visit: Payer: Self-pay

## 2020-03-22 ENCOUNTER — Ambulatory Visit (INDEPENDENT_AMBULATORY_CARE_PROVIDER_SITE_OTHER): Payer: BC Managed Care – PPO | Admitting: Family Medicine

## 2020-03-22 VITALS — BP 118/80 | HR 89 | Ht 66.0 in | Wt 163.0 lb

## 2020-03-22 DIAGNOSIS — B351 Tinea unguium: Secondary | ICD-10-CM

## 2020-03-22 DIAGNOSIS — I10 Essential (primary) hypertension: Secondary | ICD-10-CM

## 2020-03-22 DIAGNOSIS — G43509 Persistent migraine aura without cerebral infarction, not intractable, without status migrainosus: Secondary | ICD-10-CM

## 2020-03-22 DIAGNOSIS — R7301 Impaired fasting glucose: Secondary | ICD-10-CM | POA: Diagnosis not present

## 2020-03-22 DIAGNOSIS — E785 Hyperlipidemia, unspecified: Secondary | ICD-10-CM

## 2020-03-22 MED ORDER — TERBINAFINE HCL 250 MG PO TABS
250.0000 mg | ORAL_TABLET | Freq: Every day | ORAL | 0 refills | Status: DC
Start: 2020-03-22 — End: 2021-03-01

## 2020-03-22 NOTE — Progress Notes (Signed)
Established Patient Office Visit  Subjective:  Patient ID: Pamela Stevens, female    DOB: 08/23/1970  Age: 49 y.o. MRN: [id number]  CC:  Chief Complaint  Patient presents with  . Hypertension    HPI [name] presents for   Hypertension- Pt denies chest pain, SOB, dizziness, or heart palpitations.  Taking meds as directed w/o problems.  Denies medication side effects.  She has a new job now and her stress levels are much better.   But she has not exercised regularly for about the last 6 weeks but plans on getting back on track.  Impaired fasting glucose-no increased thirst or urination. No symptoms consistent with hypoglycemia.  F/U migraines -overall she is doing okay she says she really has never tried the rescue medicine.  Taking the Topamax and says it does cause a little bit of numbness and tingling in the fingertips.  He wants me to look at her second toe on her left foot.  She has been on Lamisil for about 70 days and really has not noticed any significant improvement in the appearance.  She had similar thickening on her fourth toenail but says she actually hit it on something and the nail came off completely.  The nail that has grown back out looks completely clear and normal.  The nail on the second toe just does not look like it is growing at all.  Past Medical History:  Diagnosis Date  . DIABETES MELLITUS, GESTATIONAL, INSULIN-DEPENDENT 03/05/2007   Qualifier: Diagnosis of  By: [name]    . Gestational diabetes   . Hypertension   . PCOS (polycystic ovarian syndrome)     Past Surgical History:  Procedure Laterality Date  . NO PAST SURGERIES      Family History  Problem Relation Age of Onset  . Diabetes Mellitus II Mother   . Hypertension Mother   . Diabetes Mellitus II Father   . Hypercholesterolemia Father   . Hypertension Father   . Heart disease Father   . Hypertension Brother     Social History   Socioeconomic History  .  Marital status: Married    Spouse name: Not on file  . Number of children: 2  . Years of education: Not on file  . Highest education level: Not on file  Occupational History  . Occupation: Control and instrumentation engineer  Tobacco Use  . Smoking status: Never Smoker  . Smokeless tobacco: Never Used  Substance and Sexual Activity  . Alcohol use: Yes    Alcohol/week: 0.0 - 1.0 standard drinks  . Drug use: Not on file  . Sexual activity: Yes    Partners: Male  Other Topics Concern  . Not on file  Social History Narrative   Some exercising. 1-2 caffeine drinks per day.    Social Determinants of Health   Financial Resource Strain: Not on file  Food Insecurity: Not on file  Transportation Needs: Not on file  Physical Activity: Not on file  Stress: Not on file  Social Connections: Not on file  Intimate Partner Violence: Not on file    Outpatient Medications Prior to Visit  Medication Sig Dispense Refill  . amLODipine (NORVASC) 10 MG tablet TAKE 1 TABLET BY MOUTH EVERY DAY 90 tablet 0  . hydrochlorothiazide (HYDRODIURIL) 25 MG tablet TAKE 1 TABLET BY MOUTH EVERY DAY 30 tablet 0  . ipratropium (ATROVENT) 0.06 % nasal spray Place 2 sprays into both nostrils 4 (four) times daily. Prn sinus congestion  15 mL 1  . topiramate (TOPAMAX) 25 MG tablet Take 1 tablet (25 mg total) by mouth 2 (two) times daily. 180 tablet 1  . amoxicillin-clavulanate (AUGMENTIN) 875-125 MG tablet Take 1 tablet by mouth 2 (two) times daily. FILL AND TAKE ONLY IF SEVERE NASAL CONGESTION DEVELOPS 10 tablet 0  . HYDROcodone-homatropine (HYCODAN) 5-1.5 MG/5ML syrup Take 5 mLs by mouth every 8 (eight) hours as needed for cough. 120 mL 0  . predniSONE (DELTASONE) 20 MG tablet Take 1 tablet (20 mg total) by mouth 2 (two) times daily with a meal. 10 tablet 0   No facility-administered medications prior to visit.    Allergies  Allergen Reactions  . Avapro [Irbesartan] Other (See Comments)    Hair Loss  .  Clindamycin/Lincomycin Itching    Hair loss  . Lisinopril Cough    ROS Review of Systems    Objective:    Physical Exam  BP 118/80   Pulse 89   Ht 5\' 6"  (1.676 m)   Wt 163 lb (73.9 kg)   SpO2 97%   BMI 26.31 kg/m  Wt Readings from Last 3 Encounters:  03/22/20 163 lb (73.9 kg)  08/31/19 142 lb (64.4 kg)  01/10/19 152 lb (68.9 kg)     Health Maintenance Due  Topic Date Due  . Hepatitis C Screening  Never done  . HIV Screening  Never done  . COLONOSCOPY (Pts 45-45yrs Insurance coverage will need to be confirmed)  Never done    There are no preventive care reminders to display for this patient.  Lab Results  Component Value Date   TSH 1.82 01/07/2019   Lab Results  Component Value Date   WBC 7.7 02/04/2019   HGB 15.3 02/04/2019   HCT 45.6 (H) 02/04/2019   MCV 87.9 02/04/2019   PLT 373 02/04/2019   Lab Results  Component Value Date   NA 138 01/07/2019   K 3.5 01/07/2019   CO2 25 01/07/2019   GLUCOSE 103 (H) 01/07/2019   BUN 14 01/07/2019   CREATININE 0.96 01/07/2019   BILITOT 0.8 01/07/2019   ALKPHOS 73 12/23/2013   AST 15 01/07/2019   ALT 11 01/07/2019   PROT 7.8 01/07/2019   ALBUMIN 4.8 12/23/2013   CALCIUM 10.1 01/07/2019   Lab Results  Component Value Date   CHOL 238 (H) 01/07/2019   Lab Results  Component Value Date   HDL 43 (L) 01/07/2019   Lab Results  Component Value Date   LDLCALC 161 (H) 01/07/2019   Lab Results  Component Value Date   TRIG 182 (H) 01/07/2019   Lab Results  Component Value Date   CHOLHDL 5.5 (H) 01/07/2019   Lab Results  Component Value Date   HGBA1C 5.5 01/07/2019      Assessment & Plan:   Problem List Items Addressed This Visit      Cardiovascular and Mediastinum   Persistent migraine aura without cerebral infarction and without status migrainosus, not intractable    Stable.  No recent flares or exacerbations.  Continue Topamax.      Hypertension goal BP (blood pressure) < 130/80    Really  wants to come off of her blood pressure medications I discussed stopping one at a time 2 weeks apart and then monitoring blood pressure with a goal of keeping her systolic under 130 and her diastolic under 85.  To you to work on healthy diet and regular exercise.      Relevant Orders   Lipid Panel w/reflex  Direct LDL   COMPLETE METABOLIC PANEL WITH GFR   TSH   CBC     Endocrine   IFG (impaired fasting glucose)    You for A1c.  Will order with labs.      Relevant Orders   Lipid Panel w/reflex Direct LDL   COMPLETE METABOLIC PANEL WITH GFR   TSH   CBC   Hemoglobin A1c     Other   Hyperlipidemia - Primary    Due for lipid screening.      Relevant Orders   Lipid Panel w/reflex Direct LDL   COMPLETE METABOLIC PANEL WITH GFR   TSH   CBC    Other Visit Diagnoses    Onychomycosis       Relevant Medications   terbinafine (LAMISIL) 250 MG tablet      Onychomycosis  - discussed continuing the Lamisil for an additional 3 months.  If at that point the nail still not growing out could consider nail removal for cosmetic reasons.  Then the new nail can try to grow out.  Also consider that it may not be responding because it is a dystrophic nail and discussed that potential diagnosis with her.  They did not do a nail culture for confirmation.  Meds ordered this encounter  Medications  . terbinafine (LAMISIL) 250 MG tablet    Sig: Take 1 tablet (250 mg total) by mouth daily.    Dispense:  90 tablet    Refill:  0    Follow-up: Return in about 6 months (around 09/20/2020) for BP and Migraines.    Nani Gasser, MD

## 2020-03-22 NOTE — Assessment & Plan Note (Signed)
You for A1c.  Will order with labs.

## 2020-03-22 NOTE — Assessment & Plan Note (Signed)
Really wants to come off of her blood pressure medications I discussed stopping one at a time 2 weeks apart and then monitoring blood pressure with a goal of keeping her systolic under 130 and her diastolic under 85.  To you to work on healthy diet and regular exercise.

## 2020-03-22 NOTE — Patient Instructions (Signed)
Try to keep your BP under 130/85.

## 2020-03-22 NOTE — Assessment & Plan Note (Signed)
Due for lipid screening 

## 2020-03-22 NOTE — Assessment & Plan Note (Signed)
Stable.  No recent flares or exacerbations.  Continue Topamax.

## 2020-04-02 ENCOUNTER — Other Ambulatory Visit: Payer: Self-pay | Admitting: Family Medicine

## 2020-04-19 DIAGNOSIS — F411 Generalized anxiety disorder: Secondary | ICD-10-CM | POA: Diagnosis not present

## 2020-04-26 DIAGNOSIS — F411 Generalized anxiety disorder: Secondary | ICD-10-CM | POA: Diagnosis not present

## 2020-05-03 DIAGNOSIS — F411 Generalized anxiety disorder: Secondary | ICD-10-CM | POA: Diagnosis not present

## 2020-05-04 DIAGNOSIS — F411 Generalized anxiety disorder: Secondary | ICD-10-CM | POA: Diagnosis not present

## 2020-05-08 DIAGNOSIS — F411 Generalized anxiety disorder: Secondary | ICD-10-CM | POA: Diagnosis not present

## 2020-05-09 DIAGNOSIS — F411 Generalized anxiety disorder: Secondary | ICD-10-CM | POA: Diagnosis not present

## 2020-05-10 DIAGNOSIS — F411 Generalized anxiety disorder: Secondary | ICD-10-CM | POA: Diagnosis not present

## 2020-05-16 DIAGNOSIS — F411 Generalized anxiety disorder: Secondary | ICD-10-CM | POA: Diagnosis not present

## 2020-05-17 DIAGNOSIS — F411 Generalized anxiety disorder: Secondary | ICD-10-CM | POA: Diagnosis not present

## 2020-05-18 DIAGNOSIS — F411 Generalized anxiety disorder: Secondary | ICD-10-CM | POA: Diagnosis not present

## 2020-05-29 DIAGNOSIS — F411 Generalized anxiety disorder: Secondary | ICD-10-CM | POA: Diagnosis not present

## 2020-06-05 DIAGNOSIS — F411 Generalized anxiety disorder: Secondary | ICD-10-CM | POA: Diagnosis not present

## 2020-06-11 DIAGNOSIS — F411 Generalized anxiety disorder: Secondary | ICD-10-CM | POA: Diagnosis not present

## 2020-06-14 DIAGNOSIS — F411 Generalized anxiety disorder: Secondary | ICD-10-CM | POA: Diagnosis not present

## 2020-06-15 DIAGNOSIS — F411 Generalized anxiety disorder: Secondary | ICD-10-CM | POA: Diagnosis not present

## 2020-07-02 DIAGNOSIS — F411 Generalized anxiety disorder: Secondary | ICD-10-CM | POA: Diagnosis not present

## 2020-07-03 DIAGNOSIS — F411 Generalized anxiety disorder: Secondary | ICD-10-CM | POA: Diagnosis not present

## 2020-07-05 DIAGNOSIS — F411 Generalized anxiety disorder: Secondary | ICD-10-CM | POA: Diagnosis not present

## 2020-07-17 DIAGNOSIS — F411 Generalized anxiety disorder: Secondary | ICD-10-CM | POA: Diagnosis not present

## 2020-07-18 DIAGNOSIS — F411 Generalized anxiety disorder: Secondary | ICD-10-CM | POA: Diagnosis not present

## 2020-07-24 DIAGNOSIS — F411 Generalized anxiety disorder: Secondary | ICD-10-CM | POA: Diagnosis not present

## 2020-07-26 DIAGNOSIS — F411 Generalized anxiety disorder: Secondary | ICD-10-CM | POA: Diagnosis not present

## 2020-07-27 DIAGNOSIS — F411 Generalized anxiety disorder: Secondary | ICD-10-CM | POA: Diagnosis not present

## 2020-07-31 DIAGNOSIS — F411 Generalized anxiety disorder: Secondary | ICD-10-CM | POA: Diagnosis not present

## 2020-08-02 DIAGNOSIS — F411 Generalized anxiety disorder: Secondary | ICD-10-CM | POA: Diagnosis not present

## 2020-08-03 DIAGNOSIS — F411 Generalized anxiety disorder: Secondary | ICD-10-CM | POA: Diagnosis not present

## 2020-08-14 DIAGNOSIS — F411 Generalized anxiety disorder: Secondary | ICD-10-CM | POA: Diagnosis not present

## 2020-08-15 DIAGNOSIS — F411 Generalized anxiety disorder: Secondary | ICD-10-CM | POA: Diagnosis not present

## 2020-08-16 DIAGNOSIS — F411 Generalized anxiety disorder: Secondary | ICD-10-CM | POA: Diagnosis not present

## 2020-08-20 DIAGNOSIS — F411 Generalized anxiety disorder: Secondary | ICD-10-CM | POA: Diagnosis not present

## 2020-08-21 DIAGNOSIS — F411 Generalized anxiety disorder: Secondary | ICD-10-CM | POA: Diagnosis not present

## 2020-08-27 DIAGNOSIS — F411 Generalized anxiety disorder: Secondary | ICD-10-CM | POA: Diagnosis not present

## 2020-08-28 DIAGNOSIS — F411 Generalized anxiety disorder: Secondary | ICD-10-CM | POA: Diagnosis not present

## 2020-08-30 DIAGNOSIS — F411 Generalized anxiety disorder: Secondary | ICD-10-CM | POA: Diagnosis not present

## 2020-09-05 DIAGNOSIS — F411 Generalized anxiety disorder: Secondary | ICD-10-CM | POA: Diagnosis not present

## 2020-10-01 DIAGNOSIS — F411 Generalized anxiety disorder: Secondary | ICD-10-CM | POA: Diagnosis not present

## 2020-10-02 DIAGNOSIS — F411 Generalized anxiety disorder: Secondary | ICD-10-CM | POA: Diagnosis not present

## 2020-10-03 DIAGNOSIS — F411 Generalized anxiety disorder: Secondary | ICD-10-CM | POA: Diagnosis not present

## 2020-10-08 DIAGNOSIS — F411 Generalized anxiety disorder: Secondary | ICD-10-CM | POA: Diagnosis not present

## 2020-10-09 ENCOUNTER — Ambulatory Visit: Payer: Self-pay | Admitting: Family Medicine

## 2020-10-09 DIAGNOSIS — F411 Generalized anxiety disorder: Secondary | ICD-10-CM | POA: Diagnosis not present

## 2020-10-11 DIAGNOSIS — F411 Generalized anxiety disorder: Secondary | ICD-10-CM | POA: Diagnosis not present

## 2020-10-15 DIAGNOSIS — F411 Generalized anxiety disorder: Secondary | ICD-10-CM | POA: Diagnosis not present

## 2020-10-16 DIAGNOSIS — F411 Generalized anxiety disorder: Secondary | ICD-10-CM | POA: Diagnosis not present

## 2020-10-17 DIAGNOSIS — F411 Generalized anxiety disorder: Secondary | ICD-10-CM | POA: Diagnosis not present

## 2020-10-22 DIAGNOSIS — F411 Generalized anxiety disorder: Secondary | ICD-10-CM | POA: Diagnosis not present

## 2020-10-23 DIAGNOSIS — F411 Generalized anxiety disorder: Secondary | ICD-10-CM | POA: Diagnosis not present

## 2020-10-29 DIAGNOSIS — F411 Generalized anxiety disorder: Secondary | ICD-10-CM | POA: Diagnosis not present

## 2020-10-30 DIAGNOSIS — F411 Generalized anxiety disorder: Secondary | ICD-10-CM | POA: Diagnosis not present

## 2020-11-01 DIAGNOSIS — F411 Generalized anxiety disorder: Secondary | ICD-10-CM | POA: Diagnosis not present

## 2020-11-05 DIAGNOSIS — F411 Generalized anxiety disorder: Secondary | ICD-10-CM | POA: Diagnosis not present

## 2020-11-06 DIAGNOSIS — F411 Generalized anxiety disorder: Secondary | ICD-10-CM | POA: Diagnosis not present

## 2020-11-12 DIAGNOSIS — F411 Generalized anxiety disorder: Secondary | ICD-10-CM | POA: Diagnosis not present

## 2020-11-19 DIAGNOSIS — F411 Generalized anxiety disorder: Secondary | ICD-10-CM | POA: Diagnosis not present

## 2020-11-20 DIAGNOSIS — F411 Generalized anxiety disorder: Secondary | ICD-10-CM | POA: Diagnosis not present

## 2020-11-22 DIAGNOSIS — F411 Generalized anxiety disorder: Secondary | ICD-10-CM | POA: Diagnosis not present

## 2020-11-27 DIAGNOSIS — F411 Generalized anxiety disorder: Secondary | ICD-10-CM | POA: Diagnosis not present

## 2020-11-28 DIAGNOSIS — F411 Generalized anxiety disorder: Secondary | ICD-10-CM | POA: Diagnosis not present

## 2020-11-29 DIAGNOSIS — F411 Generalized anxiety disorder: Secondary | ICD-10-CM | POA: Diagnosis not present

## 2020-12-03 DIAGNOSIS — F411 Generalized anxiety disorder: Secondary | ICD-10-CM | POA: Diagnosis not present

## 2020-12-05 DIAGNOSIS — F411 Generalized anxiety disorder: Secondary | ICD-10-CM | POA: Diagnosis not present

## 2020-12-06 DIAGNOSIS — F411 Generalized anxiety disorder: Secondary | ICD-10-CM | POA: Diagnosis not present

## 2020-12-17 DIAGNOSIS — F411 Generalized anxiety disorder: Secondary | ICD-10-CM | POA: Diagnosis not present

## 2020-12-18 DIAGNOSIS — F411 Generalized anxiety disorder: Secondary | ICD-10-CM | POA: Diagnosis not present

## 2020-12-20 DIAGNOSIS — F411 Generalized anxiety disorder: Secondary | ICD-10-CM | POA: Diagnosis not present

## 2020-12-24 DIAGNOSIS — F411 Generalized anxiety disorder: Secondary | ICD-10-CM | POA: Diagnosis not present

## 2020-12-25 DIAGNOSIS — F411 Generalized anxiety disorder: Secondary | ICD-10-CM | POA: Diagnosis not present

## 2020-12-27 DIAGNOSIS — F411 Generalized anxiety disorder: Secondary | ICD-10-CM | POA: Diagnosis not present

## 2020-12-31 DIAGNOSIS — F411 Generalized anxiety disorder: Secondary | ICD-10-CM | POA: Diagnosis not present

## 2021-01-01 DIAGNOSIS — F411 Generalized anxiety disorder: Secondary | ICD-10-CM | POA: Diagnosis not present

## 2021-01-03 DIAGNOSIS — F411 Generalized anxiety disorder: Secondary | ICD-10-CM | POA: Diagnosis not present

## 2021-01-07 DIAGNOSIS — F411 Generalized anxiety disorder: Secondary | ICD-10-CM | POA: Diagnosis not present

## 2021-01-08 DIAGNOSIS — F411 Generalized anxiety disorder: Secondary | ICD-10-CM | POA: Diagnosis not present

## 2021-01-10 DIAGNOSIS — F411 Generalized anxiety disorder: Secondary | ICD-10-CM | POA: Diagnosis not present

## 2021-01-14 DIAGNOSIS — F411 Generalized anxiety disorder: Secondary | ICD-10-CM | POA: Diagnosis not present

## 2021-01-17 ENCOUNTER — Telehealth: Payer: Self-pay | Admitting: *Deleted

## 2021-01-17 DIAGNOSIS — F411 Generalized anxiety disorder: Secondary | ICD-10-CM | POA: Diagnosis not present

## 2021-01-17 DIAGNOSIS — Z1211 Encounter for screening for malignant neoplasm of colon: Secondary | ICD-10-CM

## 2021-01-17 NOTE — Telephone Encounter (Signed)
Pt lvm asking that a referral for GI be placed for her to have a colonoscopy done.  LVM advising pt that referral has been placed.

## 2021-01-22 DIAGNOSIS — F411 Generalized anxiety disorder: Secondary | ICD-10-CM | POA: Diagnosis not present

## 2021-01-23 DIAGNOSIS — F411 Generalized anxiety disorder: Secondary | ICD-10-CM | POA: Diagnosis not present

## 2021-01-24 DIAGNOSIS — F411 Generalized anxiety disorder: Secondary | ICD-10-CM | POA: Diagnosis not present

## 2021-01-28 DIAGNOSIS — F411 Generalized anxiety disorder: Secondary | ICD-10-CM | POA: Diagnosis not present

## 2021-01-29 DIAGNOSIS — F411 Generalized anxiety disorder: Secondary | ICD-10-CM | POA: Diagnosis not present

## 2021-01-31 ENCOUNTER — Encounter: Payer: Self-pay | Admitting: Gastroenterology

## 2021-02-05 DIAGNOSIS — F411 Generalized anxiety disorder: Secondary | ICD-10-CM | POA: Diagnosis not present

## 2021-02-06 DIAGNOSIS — F411 Generalized anxiety disorder: Secondary | ICD-10-CM | POA: Diagnosis not present

## 2021-02-07 DIAGNOSIS — F411 Generalized anxiety disorder: Secondary | ICD-10-CM | POA: Diagnosis not present

## 2021-02-13 DIAGNOSIS — F411 Generalized anxiety disorder: Secondary | ICD-10-CM | POA: Diagnosis not present

## 2021-02-20 DIAGNOSIS — Z124 Encounter for screening for malignant neoplasm of cervix: Secondary | ICD-10-CM | POA: Diagnosis not present

## 2021-02-20 DIAGNOSIS — Z6828 Body mass index (BMI) 28.0-28.9, adult: Secondary | ICD-10-CM | POA: Diagnosis not present

## 2021-02-20 DIAGNOSIS — F411 Generalized anxiety disorder: Secondary | ICD-10-CM | POA: Diagnosis not present

## 2021-02-20 DIAGNOSIS — Z01419 Encounter for gynecological examination (general) (routine) without abnormal findings: Secondary | ICD-10-CM | POA: Diagnosis not present

## 2021-02-20 DIAGNOSIS — Z1151 Encounter for screening for human papillomavirus (HPV): Secondary | ICD-10-CM | POA: Diagnosis not present

## 2021-02-20 LAB — HM PAP SMEAR: HM Pap smear: NEGATIVE

## 2021-02-22 DIAGNOSIS — Z1231 Encounter for screening mammogram for malignant neoplasm of breast: Secondary | ICD-10-CM | POA: Diagnosis not present

## 2021-02-22 LAB — HM MAMMOGRAPHY

## 2021-02-25 DIAGNOSIS — F411 Generalized anxiety disorder: Secondary | ICD-10-CM | POA: Diagnosis not present

## 2021-02-26 DIAGNOSIS — F411 Generalized anxiety disorder: Secondary | ICD-10-CM | POA: Diagnosis not present

## 2021-02-28 DIAGNOSIS — F411 Generalized anxiety disorder: Secondary | ICD-10-CM | POA: Diagnosis not present

## 2021-03-01 ENCOUNTER — Ambulatory Visit (AMBULATORY_SURGERY_CENTER): Payer: BC Managed Care – PPO | Admitting: *Deleted

## 2021-03-01 ENCOUNTER — Other Ambulatory Visit: Payer: Self-pay

## 2021-03-01 VITALS — Ht 66.0 in | Wt 163.0 lb

## 2021-03-01 DIAGNOSIS — Z1211 Encounter for screening for malignant neoplasm of colon: Secondary | ICD-10-CM

## 2021-03-01 MED ORDER — SUTAB 1479-225-188 MG PO TABS: 1.0000 | ORAL_TABLET | ORAL | 0 refills | Status: DC

## 2021-03-01 NOTE — Progress Notes (Signed)
Patient's pre-visit was done today over the phone with the patient. Name,DOB and address verified. Patient denies any allergies to Eggs and Soy. Patient denies any problems with anesthesia/sedation. Patient is not taking any diet pills or blood thinners. No home Oxygen. Packet of Prep instructions mailed to patient including a copy of a consent form and Sutab prep coupon-pt is aware. Prep instructions sent to pt's MyChart (if activated).Patient understands to call us back with any questions or concerns. Patient is aware of our care-partner policy and Covid-19 safety protocol.   EMMI education assigned to the patient for the procedure, sent to MyChart.   The patient is COVID-19 vaccinated.

## 2021-03-06 ENCOUNTER — Encounter: Payer: Self-pay | Admitting: Gastroenterology

## 2021-03-20 ENCOUNTER — Encounter: Payer: Self-pay | Admitting: Gastroenterology

## 2021-03-20 ENCOUNTER — Other Ambulatory Visit: Payer: Self-pay

## 2021-03-20 ENCOUNTER — Ambulatory Visit (AMBULATORY_SURGERY_CENTER): Payer: BC Managed Care – PPO | Admitting: Gastroenterology

## 2021-03-20 VITALS — BP 132/85 | HR 70 | Temp 98.6°F | Resp 12 | Ht 66.0 in | Wt 163.0 lb

## 2021-03-20 DIAGNOSIS — Z1211 Encounter for screening for malignant neoplasm of colon: Secondary | ICD-10-CM | POA: Diagnosis not present

## 2021-03-20 MED ORDER — SODIUM CHLORIDE 0.9 % IV SOLN: 500.0000 mL | Freq: Once | INTRAVENOUS | Status: DC

## 2021-03-20 NOTE — Progress Notes (Signed)
° °  Referring Provider: [name], * Primary Care Physician:  [name], MD  Reason for Procedure:  Colon cancer screening   IMPRESSION:  Need for colon cancer screening Appropriate candidate for monitored anesthesia care  PLAN: Colonoscopy in the [facility] today   HPI: Pamela Stevens is a 50 y.o. female presents for screening colonoscopy.  No prior colonoscopy or colon cancer screening.  No baseline GI symptoms.   Maternal grandfather and paternal grandmother with colon cancer. No other known family history of colon cancer or polyps. No family history of uterine/endometrial cancer, pancreatic cancer or gastric/stomach cancer.   Past Medical History:  Diagnosis Date   Allergy    DIABETES MELLITUS, GESTATIONAL, INSULIN-DEPENDENT 03/05/2007   Qualifier: Diagnosis of  By: [name]     Exercise-induced asthma    AS A CHILD   Gestational diabetes    Hypertension    PCOS (polycystic ovarian syndrome)     Past Surgical History:  Procedure Laterality Date   NO PAST SURGERIES     WISDOM TOOTH EXTRACTION      Current Outpatient Medications  Medication Sig Dispense Refill   UNABLE TO FIND Med Name: Lactate  po     loratadine (CLARITIN) 10 MG tablet Take 10 mg by mouth daily.     MAGNESIUM PO Take by mouth.     Current Facility-Administered Medications  Medication Dose Route Frequency Provider Last Rate Last Admin   0.9 %  sodium chloride infusion  500 mL Intravenous Once [name], MD        Allergies as of 03/20/2021 - Review Complete 03/20/2021  Allergen Reaction Noted   Avapro [irbesartan] Other (See Comments) 02/04/2018   Clindamycin/lincomycin Itching 01/03/2019   Lisinopril Cough 09/28/2017   Sulfa antibiotics Hives 03/01/2021    Family History  Problem Relation Age of Onset   Diabetes Mellitus II Mother    Hypertension Mother    Diabetes Mellitus II Father    Hypercholesterolemia Father    Hypertension Father     Heart disease Father    Hypertension Brother    Colon cancer Maternal Grandfather    Colon cancer Paternal Grandmother    Colon polyps Neg Hx      Physical Exam: General:   Alert,  well-nourished, pleasant and cooperative in NAD Head:  Normocephalic and atraumatic. Eyes:  Sclera clear, no icterus.   Conjunctiva pink. Mouth:  No deformity or lesions.   Neck:  Supple; no masses or thyromegaly. Lungs:  Clear throughout to auscultation.   No wheezes. Heart:  Regular rate and rhythm; no murmurs. Abdomen:  Soft, non-tender, nondistended, normal bowel sounds, no rebound or guarding.  Msk:  Symmetrical. No boney deformities LAD: No inguinal or umbilical LAD Extremities:  No clubbing or edema. Neurologic:  Alert and  oriented x4;  grossly nonfocal Skin:  No obvious rash or bruise. Psych:  Alert and cooperative. Normal mood and affect.     Studies/Results: No results found.    [name] L. Orvan Falconer, MD, MPH 03/20/2021, 12:56 PM

## 2021-03-20 NOTE — Progress Notes (Signed)
Pt's states no medical or surgical changes since previsit or office visit. VS assessed by Amanda °

## 2021-03-20 NOTE — Progress Notes (Signed)
Report to PACU, RN, vss, BBS= Clear.  

## 2021-03-20 NOTE — Patient Instructions (Signed)
Discharge instructions given. Normal exam. Resume previous medications. YOU HAD AN ENDOSCOPIC PROCEDURE TODAY AT THE [facility]:   Refer to the procedure report that was given to you for any specific questions about what was found during the examination.  If the procedure report does not answer your questions, please call your gastroenterologist to clarify.  If you requested that your care partner not be given the details of your procedure findings, then the procedure report has been included in a sealed envelope for you to review at your convenience later.  YOU SHOULD EXPECT: Some feelings of bloating in the abdomen. Passage of more gas than usual.  Walking can help get rid of the air that was put into your GI tract during the procedure and reduce the bloating. If you had a lower endoscopy (such as a colonoscopy or flexible sigmoidoscopy) you may notice spotting of blood in your stool or on the toilet paper. If you underwent a bowel prep for your procedure, you may not have a normal bowel movement for a few days.  Please Note:  You might notice some irritation and congestion in your nose or some drainage.  This is from the oxygen used during your procedure.  There is no need for concern and it should clear up in a day or so.  SYMPTOMS TO REPORT IMMEDIATELY:  Following lower endoscopy (colonoscopy or flexible sigmoidoscopy):  Excessive amounts of blood in the stool  Significant tenderness or worsening of abdominal pains  Swelling of the abdomen that is new, acute  Fever of 100F or higher   For urgent or emergent issues, a gastroenterologist can be reached at any hour by calling [phone]. Do not use MyChart messaging for urgent concerns.    DIET:  We do recommend a small meal at first, but then you may proceed to your regular diet.  Drink plenty of fluids but you should avoid alcoholic beverages for 24 hours.  ACTIVITY:  You should plan to take it easy for the rest of  today and you should NOT DRIVE or use heavy machinery until tomorrow (because of the sedation medicines used during the test).    FOLLOW UP: Our staff will call the number listed on your records 48-72 hours following your procedure to check on you and address any questions or concerns that you may have regarding the information given to you following your procedure. If we do not reach you, we will leave a message.  We will attempt to reach you two times.  During this call, we will ask if you have developed any symptoms of COVID 19. If you develop any symptoms (ie: fever, flu-like symptoms, shortness of breath, cough etc.) before then, please call [phone].  If you test positive for Covid 19 in the 2 weeks post procedure, please call and report this information to us.    If any biopsies were taken you will be contacted by phone or by letter within the next 1-3 weeks.  Please call us at [phone] if you have not heard about the biopsies in 3 weeks.    SIGNATURES/CONFIDENTIALITY: You and/or your care partner have signed paperwork which will be entered into your electronic medical record.  These signatures attest to the fact that that the information above on your After Visit Summary has been reviewed and is understood.  Full responsibility of the confidentiality of this discharge information lies with you and/or your care-partner.  

## 2021-03-20 NOTE — Op Note (Signed)
[facility] Patient Name: Pamela Stevens Procedure Date: 03/20/2021 1:40 PM MRN: [id number] Endoscopist: [name] MD, MD Age: 50 Referring MD:  Date of Birth: 1970/07/29 Gender: Female Account #: [id number] Procedure:                Colonoscopy Indications:              Screening for colorectal malignant neoplasm, This                            is the patient's first colonoscopy                           Grandparents with colon cancer at an advanced age Medicines:                Monitored Anesthesia Care Procedure:                Pre-Anesthesia Assessment:                           - Prior to the procedure, a History and Physical                            was performed, and patient medications and                            allergies were reviewed. The patient's tolerance of                            previous anesthesia was also reviewed. The risks                            and benefits of the procedure and the sedation                            options and risks were discussed with the patient.                            All questions were answered, and informed consent                            was obtained. Prior Anticoagulants: The patient has                            taken no previous anticoagulant or antiplatelet                            agents. ASA Grade Assessment: I - A normal, healthy                            patient. After reviewing the risks and benefits,                            the patient was deemed in satisfactory condition to  undergo the procedure.                           After obtaining informed consent, the colonoscope                            was passed under direct vision. Throughout the                            procedure, the patient's blood pressure, pulse, and                            oxygen saturations were monitored continuously. The                            Olympus CF-HQ190L  [id number]) Colonoscope was                            introduced through the anus and advanced to the 3                            cm into the ileum. A second forward view of the                            right colon was performed. The colonoscopy was                            performed without difficulty. The patient tolerated                            the procedure well. The quality of the bowel                            preparation was good. The terminal ileum, ileocecal                            valve, appendiceal orifice, and rectum were                            photographed. Scope In: 1:49:10 PM Scope Out: 2:03:37 PM Scope Withdrawal Time: 0 hours 10 minutes 56 seconds  Total Procedure Duration: 0 hours 14 minutes 27 seconds  Findings:                 The perianal and digital rectal examinations were                            normal.                           The entire examined colon appeared normal on direct                            and retroflexion views. Complications:            No immediate complications. Estimated Blood  Loss:     Estimated blood loss: none. Impression:               - The entire examined colon is normal on direct and                            retroflexion views. Recommendation:           - Patient has a contact number available for                            emergencies. The signs and symptoms of potential                            delayed complications were discussed with the                            patient. Return to normal activities tomorrow.                            Written discharge instructions were provided to the                            patient.                           - Resume previous diet.                           - Continue present medications.                           - Repeat colonoscopy in 10 years for surveillance,                            earlier with new symptoms.                           - Emerging evidence  supports eating a diet of                            fruits, vegetables, grains, calcium, and yogurt                            while reducing red meat and alcohol may reduce the                            risk of colon cancer.                           - Thank you for allowing me to be involved in your                            colon cancer prevention. [name] MD, MD 03/20/2021 2:08:07 PM This report has been signed electronically.

## 2021-03-22 ENCOUNTER — Telehealth: Payer: Self-pay | Admitting: *Deleted

## 2021-03-22 NOTE — Telephone Encounter (Signed)
°  Follow up Call-  Call back number 03/20/2021  Post procedure Call Back phone  # [phone]  Permission to leave phone message Yes  Some recent data might be hidden     Patient questions:  Do you have a fever, pain , or abdominal swelling? No. Pain Score  0 *  Have you tolerated food without any problems? Yes.    Have you been able to return to your normal activities? Yes.    Do you have any questions about your discharge instructions: Diet   No. Medications  No. Follow up visit  No.  Do you have questions or concerns about your Care? No.  Actions: * If pain score is 4 or above: No action needed, pain <4.  Have you developed a fever since your procedure? no  2.   Have you had an respiratory symptoms (SOB or cough) since your procedure? no  3.   Have you tested positive for COVID 19 since your procedure no  4.   Have you had any family members/close contacts diagnosed with the COVID 19 since your procedure?  no   If yes to any of these questions please route to Laverna Peace, RN and Karlton Lemon, RN

## 2021-03-26 DIAGNOSIS — F411 Generalized anxiety disorder: Secondary | ICD-10-CM | POA: Diagnosis not present

## 2021-03-27 DIAGNOSIS — F411 Generalized anxiety disorder: Secondary | ICD-10-CM | POA: Diagnosis not present

## 2021-03-28 DIAGNOSIS — F411 Generalized anxiety disorder: Secondary | ICD-10-CM | POA: Diagnosis not present

## 2021-04-01 DIAGNOSIS — F411 Generalized anxiety disorder: Secondary | ICD-10-CM | POA: Diagnosis not present

## 2021-04-02 DIAGNOSIS — F411 Generalized anxiety disorder: Secondary | ICD-10-CM | POA: Diagnosis not present

## 2021-04-04 DIAGNOSIS — F411 Generalized anxiety disorder: Secondary | ICD-10-CM | POA: Diagnosis not present

## 2021-04-08 DIAGNOSIS — F411 Generalized anxiety disorder: Secondary | ICD-10-CM | POA: Diagnosis not present

## 2021-04-09 DIAGNOSIS — F411 Generalized anxiety disorder: Secondary | ICD-10-CM | POA: Diagnosis not present

## 2021-04-10 DIAGNOSIS — F411 Generalized anxiety disorder: Secondary | ICD-10-CM | POA: Diagnosis not present

## 2021-04-15 ENCOUNTER — Encounter: Payer: Self-pay | Admitting: Family Medicine

## 2021-04-22 DIAGNOSIS — F411 Generalized anxiety disorder: Secondary | ICD-10-CM | POA: Diagnosis not present

## 2021-04-23 DIAGNOSIS — F411 Generalized anxiety disorder: Secondary | ICD-10-CM | POA: Diagnosis not present

## 2021-04-25 ENCOUNTER — Telehealth: Payer: Self-pay

## 2021-04-25 DIAGNOSIS — F411 Generalized anxiety disorder: Secondary | ICD-10-CM | POA: Diagnosis not present

## 2021-04-25 NOTE — Telephone Encounter (Signed)
Pamela Stevens called stating she has heart flutters and elevated blood pressure. Also some upper chest pain "not near the heart". I advised her to go to the ED. She declined because she feels fine. Denies headaches or shortness of breath. She has been scheduled for tomorrow. Declined an appointment for today.    150/100 138/86 128/91 128/92 136/85

## 2021-04-26 ENCOUNTER — Ambulatory Visit (INDEPENDENT_AMBULATORY_CARE_PROVIDER_SITE_OTHER): Payer: BC Managed Care – PPO | Admitting: Family Medicine

## 2021-04-26 ENCOUNTER — Encounter: Payer: Self-pay | Admitting: Family Medicine

## 2021-04-26 ENCOUNTER — Other Ambulatory Visit: Payer: Self-pay

## 2021-04-26 VITALS — BP 140/99 | HR 94 | Resp 18 | Ht 66.0 in | Wt 172.0 lb

## 2021-04-26 DIAGNOSIS — R42 Dizziness and giddiness: Secondary | ICD-10-CM

## 2021-04-26 DIAGNOSIS — I10 Essential (primary) hypertension: Secondary | ICD-10-CM | POA: Diagnosis not present

## 2021-04-26 DIAGNOSIS — R002 Palpitations: Secondary | ICD-10-CM | POA: Diagnosis not present

## 2021-04-26 DIAGNOSIS — E785 Hyperlipidemia, unspecified: Secondary | ICD-10-CM | POA: Diagnosis not present

## 2021-04-26 MED ORDER — HYDROCHLOROTHIAZIDE 12.5 MG PO CAPS: 12.5000 mg | ORAL_CAPSULE | Freq: Every day | ORAL | 1 refills | Status: DC

## 2021-04-26 NOTE — Assessment & Plan Note (Signed)
She is not fasting today but will consider getting lipids when I see her back next time.  We will focus on just getting the blood pressure and palpitations under good control today.

## 2021-04-26 NOTE — Assessment & Plan Note (Signed)
We discussed options.  We will go ahead and restart hydrochlorothiazide which she took previously.  Start with 12.5 mg daily and follow-up in 1 month.  She can continue to track blood pressures at home as well.  Continue to work on improving diet and trying to increase activity as tolerated.

## 2021-04-26 NOTE — Progress Notes (Signed)
Established Patient Office Visit  Subjective:  Patient ID: Pamela Stevens, female    DOB: [birth year]  Age: 51 y.o. MRN: [id number]  CC:  Chief Complaint  Patient presents with   Elevated BP     And elevated heart rate since 02/2021. Patient states she feels lightheaded at times.     HPI Tazia R Harper-Bartell presents for increased HR since 02/2021.    She reports that she has been having episodes of feeling like her heart is racing.  When she checks it its running mostly in the 80s to 90s with the highest being about 102.  But it feels like it is racing when she is just sitting at rest.  She says she has had a really stressful year but says when it happens she is not necessarily worrying or thinking about those stressors.  Unfortunately her dad was diagnosed with cancer in December 2001 and so she has been a lot of that next year traveling back and forth to [place] to help take care of him and started not being able to take as good care of herself she started eating more fast food and not exercising and so has gained back some of the weight.  Then her husband had a stroke in December.  And she had an uncle die recently in January.  She said she decided to get back on track and start exercising again but noticed that she was feeling lightheaded with exercise.  She was on blood pressure medication at 1 point in time but really made some lifestyle changes and was able to come off.  She had noted that she been checking her blood pressure at home and those have been mildly elevated similar to today's blood pressure as well.  Also been having a little bit of discomfort in the upper chest area on and off.    150/100 138/86 128/91 128/92 136/85  Past Medical History:  Diagnosis Date   Allergy    DIABETES MELLITUS, GESTATIONAL, INSULIN-DEPENDENT 03/05/2007   Qualifier: Diagnosis of  By: Valetta Close DO, Santiago Glad     Exercise-induced asthma    AS A CHILD   Gestational diabetes     Hypertension    PCOS (polycystic ovarian syndrome)     Past Surgical History:  Procedure Laterality Date   NO PAST SURGERIES     WISDOM TOOTH EXTRACTION      Family History  Problem Relation Age of Onset   Diabetes Mellitus II Mother    Hypertension Mother    Diabetes Mellitus II Father    Hypercholesterolemia Father    Hypertension Father    Heart disease Father    Hypertension Brother    Colon cancer Maternal Grandfather    Colon cancer Paternal Grandmother    Colon polyps Neg Hx     Social History   Socioeconomic History   Marital status: Married    Spouse name: Not on file   Number of children: 2   Years of education: Not on file   Highest education level: Not on file  Occupational History   Occupation: Doctor, 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 1-2 caffeine drinks per day.    Social Determinants of Health   Financial Resource Strain: Not on file  Food Insecurity: Not on file  Transportation Needs: Not on file  Physical Activity: Not on file  Stress: Not on file  Social Connections: Not on file  Intimate Partner Violence: Not on file    Outpatient Medications Prior to Visit  Medication Sig Dispense Refill   MAGNESIUM PO Take by mouth.     UNABLE TO FIND Med Name: Lactate  po     loratadine (CLARITIN) 10 MG tablet Take 10 mg by mouth daily.     No facility-administered medications prior to visit.    Allergies  Allergen Reactions   Avapro [Irbesartan] Other (See Comments)    Hair Loss   Clindamycin/Lincomycin Itching    Hair loss   Lisinopril Cough   Sulfa Antibiotics Hives    ROS Review of Systems    Objective:     Physical Exam Constitutional:      Appearance: She is well-developed.  HENT:     Head: Normocephalic and atraumatic.     Right Ear: External ear normal.     Left Ear: External ear normal.     Mouth/Throat:     Pharynx: Oropharynx is clear.  Eyes:     Conjunctiva/sclera: Conjunctivae normal.  Neck:     Thyroid: No thyromegaly.  Cardiovascular:     Rate and Rhythm: Normal rate and regular rhythm.     Heart sounds: Normal heart sounds.  Pulmonary:     Effort: Pulmonary effort is normal.     Breath sounds: Normal breath sounds. No wheezing.  Musculoskeletal:     Cervical back: Neck supple.  Lymphadenopathy:     Cervical: No cervical adenopathy.  Skin:    General: Skin is warm and dry.  Neurological:     Mental Status: She is alert and oriented to person, place, and time.    BP (!) 140/99    Pulse 94    Resp 18    Ht 5\' 6"  (1.676 m)    Wt 172 lb (78 kg)    LMP 04/17/2021    SpO2 98%    BMI 27.76 kg/m  Wt Readings from Last 3 Encounters:  04/26/21 172 lb (78 kg)  03/20/21 163 lb (73.9 kg)  03/01/21 163 lb (73.9 kg)     Health Maintenance Due  Topic Date Due   HIV Screening  Never done   Hepatitis C Screening  Never done   MAMMOGRAM  11/21/2018   COVID-19 Vaccine (3 - Booster for Pfizer series) 02/14/2020   PAP SMEAR-Modifier  10/16/2020   INFLUENZA VACCINE  10/22/2020    There are no preventive care reminders to display for this patient.  Lab Results  Component Value Date   TSH 1.82 01/07/2019   Lab Results  Component Value Date   WBC 7.7 02/04/2019   HGB 15.3 02/04/2019   HCT 45.6 (H) 02/04/2019   MCV 87.9 02/04/2019   PLT 373 02/04/2019   Lab Results  Component Value Date   NA 138 01/07/2019   K 3.5 01/07/2019   CO2 25 01/07/2019   GLUCOSE 103 (H) 01/07/2019   BUN 14 01/07/2019   CREATININE 0.96 01/07/2019   BILITOT 0.8 01/07/2019   ALKPHOS 73 12/23/2013   AST 15 01/07/2019   ALT 11 01/07/2019   PROT 7.8 01/07/2019   ALBUMIN 4.8 12/23/2013    CALCIUM 10.1 01/07/2019  Lab Results  Component Value Date   CHOL 238 (H) 01/07/2019   Lab Results  Component Value Date   HDL 43 (L) 01/07/2019   Lab Results  Component Value Date   LDLCALC 161 (H) 01/07/2019   Lab Results  Component Value Date   TRIG 182 (H) 01/07/2019   Lab Results  Component Value Date   CHOLHDL 5.5 (H) 01/07/2019   Lab Results  Component Value Date   HGBA1C 5.5 01/07/2019      Assessment & Plan:   Problem List Items Addressed This Visit       Cardiovascular and Mediastinum   Hypertension goal BP (blood pressure) < 130/80    We discussed options.  We will go ahead and restart hydrochlorothiazide which she took previously.  Start with 12.5 mg daily and follow-up in 1 month.  She can continue to track blood pressures at home as well.  Continue to work on improving diet and trying to increase activity as tolerated.      Relevant Medications   hydrochlorothiazide (MICROZIDE) 12.5 MG capsule   Other Relevant Orders   ECHOCARDIOGRAM COMPLETE     Other   Hyperlipidemia    She is not fasting today but will consider getting lipids when I see her back next time.  We will focus on just getting the blood pressure and palpitations under good control today.      Relevant Medications   hydrochlorothiazide (MICROZIDE) 12.5 MG capsule   Other Visit Diagnoses     Palpitations    -  Primary   Relevant Orders   COMPLETE METABOLIC PANEL WITH GFR   TSH   CBC   ECHOCARDIOGRAM COMPLETE   Episodic lightheadedness           Palpitations -suspect most likely stress related.  Discussed that sometimes they can occur even when not actively feeling stressed in the moment.  She actually had similar episodes of palpitations back in 2019 when she had a really stressful job and this does feel similar she had a treadmill stress test done at that time which was normal.  So at this point we will get an echocardiogram and try to get her blood pressure under control.  If  the palpitations are not improving then could consider a 14-day cardiac monitor for further evaluation.  Though the pulse is not indicative of a true arrhythmia.    Meds ordered this encounter  Medications   hydrochlorothiazide (MICROZIDE) 12.5 MG capsule    Sig: Take 1 capsule (12.5 mg total) by mouth daily.    Dispense:  30 capsule    Refill:  1    Follow-up: Return in about 4 weeks (around 05/24/2021) for Hypertension.    Beatrice Lecher, MD

## 2021-04-27 LAB — COMPLETE METABOLIC PANEL WITH GFR
AG Ratio: 1.7 (calc) (ref 1.0–2.5)
ALT: 12 U/L (ref 6–29)
AST: 17 U/L (ref 10–35)
Albumin: 5 g/dL (ref 3.6–5.1)
Alkaline phosphatase (APISO): 62 U/L (ref 31–125)
BUN/Creatinine Ratio: 15 (calc) (ref 6–22)
BUN: 17 mg/dL (ref 7–25)
CO2: 28 mmol/L (ref 20–32)
Calcium: 9.7 mg/dL (ref 8.6–10.2)
Chloride: 102 mmol/L (ref 98–110)
Creat: 1.17 mg/dL — ABNORMAL HIGH (ref 0.50–0.99)
Globulin: 2.9 g/dL (calc) (ref 1.9–3.7)
Glucose, Bld: 99 mg/dL (ref 65–99)
Potassium: 4.4 mmol/L (ref 3.5–5.3)
Sodium: 138 mmol/L (ref 135–146)
Total Bilirubin: 0.5 mg/dL (ref 0.2–1.2)
Total Protein: 7.9 g/dL (ref 6.1–8.1)
eGFR: 57 mL/min/{1.73_m2} — ABNORMAL LOW (ref >=60)

## 2021-04-27 LAB — CBC
HCT: 44.3 % (ref 35.0–45.0)
Hemoglobin: 15 g/dL (ref 11.7–15.5)
MCH: 29.8 pg (ref 27.0–33.0)
MCHC: 33.9 g/dL (ref 32.0–36.0)
MCV: 87.9 fL (ref 80.0–100.0)
MPV: 10.4 fL (ref 7.5–12.5)
Platelets: 364 10*3/uL (ref 140–400)
RBC: 5.04 10*6/uL (ref 3.80–5.10)
RDW: 12.6 % (ref 11.0–15.0)
WBC: 8.9 10*3/uL (ref 3.8–10.8)

## 2021-04-27 LAB — TSH: TSH: 1.25 mIU/L

## 2021-04-29 ENCOUNTER — Encounter: Payer: Self-pay | Admitting: Family Medicine

## 2021-04-29 ENCOUNTER — Telehealth: Payer: Self-pay | Admitting: Family Medicine

## 2021-04-29 DIAGNOSIS — F411 Generalized anxiety disorder: Secondary | ICD-10-CM | POA: Diagnosis not present

## 2021-04-29 NOTE — Telephone Encounter (Signed)
Lease abstract mammogram from care everywhere.

## 2021-04-29 NOTE — Progress Notes (Signed)
Tyne,  Your kidney function jumped up slightly normally run around 0.9 do you have hip 1.0 before.  But this time it was 1.1.  I do not keep an eye on that and plan to recheck it in about 2 to 3 weeks just make sure hydrating well.  Thyroid and blood count are normal.

## 2021-04-29 NOTE — Telephone Encounter (Signed)
Abstracted mammogram 

## 2021-05-15 DIAGNOSIS — F411 Generalized anxiety disorder: Secondary | ICD-10-CM | POA: Diagnosis not present

## 2021-05-16 DIAGNOSIS — F411 Generalized anxiety disorder: Secondary | ICD-10-CM | POA: Diagnosis not present

## 2021-05-18 ENCOUNTER — Other Ambulatory Visit: Payer: Self-pay | Admitting: Family Medicine

## 2021-05-18 DIAGNOSIS — I10 Essential (primary) hypertension: Secondary | ICD-10-CM

## 2021-05-20 DIAGNOSIS — F411 Generalized anxiety disorder: Secondary | ICD-10-CM | POA: Diagnosis not present

## 2021-05-21 ENCOUNTER — Other Ambulatory Visit: Payer: Self-pay

## 2021-05-21 ENCOUNTER — Ambulatory Visit (HOSPITAL_BASED_OUTPATIENT_CLINIC_OR_DEPARTMENT_OTHER)
Admission: RE | Admit: 2021-05-21 | Discharge: 2021-05-21 | Disposition: A | Discharge status: Home / Self Care | Payer: BC Managed Care – PPO | Source: Ambulatory Visit | Attending: Family Medicine | Admitting: Family Medicine

## 2021-05-21 DIAGNOSIS — R002 Palpitations: Secondary | ICD-10-CM

## 2021-05-21 DIAGNOSIS — I10 Essential (primary) hypertension: Secondary | ICD-10-CM | POA: Diagnosis not present

## 2021-05-21 DIAGNOSIS — F411 Generalized anxiety disorder: Secondary | ICD-10-CM | POA: Diagnosis not present

## 2021-05-21 NOTE — Progress Notes (Signed)
°  Echocardiogram 2D Echocardiogram has been performed.  Pamela Stevens F 05/21/2021, 4:41 PM

## 2021-05-22 DIAGNOSIS — F411 Generalized anxiety disorder: Secondary | ICD-10-CM | POA: Diagnosis not present

## 2021-05-22 LAB — ECHOCARDIOGRAM COMPLETE
AR max vel: 2.46 cm2
AV Area VTI: 2.35 cm2
AV Area mean vel: 2.39 cm2
AV Mean grad: 5 mmHg
AV Peak grad: 8.9 mmHg
Ao pk vel: 1.49 m/s
Area-P 1/2: 4.49 cm2
S' Lateral: 2.7 cm

## 2021-05-22 NOTE — Progress Notes (Signed)
Hi Berklie,  The ejection fraction of your heart is around 60 to 65% which is normal.  No wall motion abnormalities which is great.  Just some slight Lee decreased relaxation of the heart which we call diastolic dysfunction.  It is mild and not worrisome.  The mitral valve looks good.  There is a little bit of backflow on the tricuspid valve as well as a little bit of backflow on the aortic valve.  This is not causing your symptoms but it is something we should probably monitor over time.  So would like to repeat your echocardiogram in a 2 years.  I want to just continue to make sure that were controlling her blood pressure is much as possible so that does not put extra strain on the valves in your heart.  If your palpitations are improving being back on the blood pressure medication and that is fantastic!  If not, then we can move forward with a cardiac monitor.

## 2021-05-24 ENCOUNTER — Encounter: Payer: Self-pay | Admitting: Family Medicine

## 2021-05-24 ENCOUNTER — Other Ambulatory Visit: Payer: Self-pay

## 2021-05-24 ENCOUNTER — Ambulatory Visit (INDEPENDENT_AMBULATORY_CARE_PROVIDER_SITE_OTHER): Payer: BC Managed Care – PPO | Admitting: Family Medicine

## 2021-05-24 VITALS — BP 138/80 | HR 87 | Ht 66.0 in | Wt 174.0 lb

## 2021-05-24 DIAGNOSIS — I071 Rheumatic tricuspid insufficiency: Secondary | ICD-10-CM

## 2021-05-24 DIAGNOSIS — Z566 Other physical and mental strain related to work: Secondary | ICD-10-CM | POA: Diagnosis not present

## 2021-05-24 DIAGNOSIS — R002 Palpitations: Secondary | ICD-10-CM

## 2021-05-24 DIAGNOSIS — R7301 Impaired fasting glucose: Secondary | ICD-10-CM

## 2021-05-24 DIAGNOSIS — I77810 Thoracic aortic ectasia: Secondary | ICD-10-CM | POA: Insufficient documentation

## 2021-05-24 DIAGNOSIS — I10 Essential (primary) hypertension: Secondary | ICD-10-CM | POA: Diagnosis not present

## 2021-05-24 MED ORDER — HYDROCHLOROTHIAZIDE 25 MG PO TABS: 25.0000 mg | ORAL_TABLET | Freq: Every day | ORAL | 3 refills | Status: DC

## 2021-05-24 NOTE — Progress Notes (Addendum)
? ?Established Patient Office Visit ? ?Subjective:  ?Patient ID: Pamela Stevens, female    DOB: Dec 29, 1970  Age: 51 y.o. MRN: [id number] ? ?CC:  ?Chief Complaint  ?Patient presents with  ? Hypertension  ? ? ?HPI ?Pamela Stevens presents for  ? ?Hypertension- Pt denies chest pain, SOB, dizziness, or heart palpitations.  Taking meds as directed w/o problems.  Denies medication side effects.  He does feel like her palpitations have actually improved since being on the blood pressure medication. ? ?Impaired fasting glucose-no increased thirst or urination. No symptoms consistent with hypoglycemia. ? ?She did notice in the last couple of weeks that she is just felt a little bit more anxious she had gone on vacation and when she came back they had let a whole month but some people go at work.  And now she is responsible for a lot more.  She does see a therapist pretty regularly.  And they had recommended discussing medication as a possibility but she really wants to stay off the medication and try other things. ? ?She would like to go over her echocardiogram results. ? ?Past Medical History:  ?Diagnosis Date  ? Allergy   ? DIABETES MELLITUS, GESTATIONAL, INSULIN-DEPENDENT 03/05/2007  ? Qualifier: Diagnosis of  By: Esmeralda Arthur    ? Exercise-induced asthma   ? AS A CHILD  ? Gestational diabetes   ? Hypertension   ? PCOS (polycystic ovarian syndrome)   ? ? ?Past Surgical History:  ?Procedure Laterality Date  ? NO PAST SURGERIES    ? WISDOM TOOTH EXTRACTION    ? ? ?Family History  ?Problem Relation Age of Onset  ? Diabetes Mellitus II Mother   ? Hypertension Mother   ? Diabetes Mellitus II Father   ? Hypercholesterolemia Father   ? Hypertension Father   ? Heart disease Father   ? Hypertension Brother   ? Colon cancer Maternal Grandfather   ? Colon cancer Paternal Grandmother   ? Colon polyps Neg Hx   ? ? ?Social History  ? ?Socioeconomic History  ? Marital status: Married  ?  Spouse name: Not on file  ?  Number of children: 2  ? Years of education: Not on file  ? Highest education level: Not on file  ?Occupational History  ? 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 Smoking status: Never  ? Smokeless tobacco: Never  ?Vaping Use  ? Vaping Use: Never used  ?Substance and Sexual Activity  ? Alcohol use: Yes  ?  Comment: one per month per pt  ? Drug use: Not Currently  ? Sexual activity: Yes  ?  Partners: Male  ?Other Topics Concern  ? Not on file  ?Social History Narrative  ? Some exercising. 1-2 caffeine drinks per day.   ? ?Social Determinants of Health  ? ?Financial Resource Strain: Not on file  ?Food Insecurity: Not on file  ?Transportation Needs: Not on file  ?Physical Activity: Not on file  ?Stress: Not on file  ?Social Connections: Not on file  ?Intimate Partner Violence: Not on file  ? ? ?Outpatient Medications Prior to Visit  ?Medication Sig Dispense Refill  ? Chlorpheniramine Maleate (ALLERGY RELIEF PO) Take by mouth.    ? EVENING PRIMROSE OIL PO Take by mouth.    ? MAGNESIUM PO Take by mouth.    ? Multiple Vitamin (MULTIVITAMIN) capsule Take 1 capsule by mouth daily.    ? UNABLE TO FIND Med Name: Lactate  po    ?  vitamin E 200 UNIT capsule Take 200 Units by mouth daily.    ? hydrochlorothiazide (MICROZIDE) 12.5 MG capsule Take 1 capsule (12.5 mg total) by mouth daily. 30 capsule 1  ? ?No facility-administered medications prior to visit.  ? ? ?Allergies  ?Allergen Reactions  ? Avapro [Irbesartan] Other (See Comments)  ?  Hair Loss  ? Clindamycin/Lincomycin Itching  ?  Hair loss  ? Lisinopril Cough  ? Sulfa Antibiotics Hives  ? ? ?ROS ?Review of Systems ? ?  ?Objective:  ?  ?Physical Exam ?Constitutional:   ?   Appearance: Normal appearance. She is well-developed.  ?HENT:  ?   Head: Normocephalic and atraumatic.  ?Cardiovascular:  ?   Rate and Rhythm: Normal rate and regular rhythm.  ?   Heart sounds: Normal heart sounds.  ?Pulmonary:  ?   Effort: Pulmonary effort is normal.  ?   Breath  sounds: Normal breath sounds.  ?Skin: ?   General: Skin is warm and dry.  ?Neurological:  ?   Mental Status: She is alert and oriented to person, place, and time.  ?Psychiatric:     ?   Behavior: Behavior normal.  ? ? ?BP 138/80   Pulse 87   Ht 5' 6"  (1.676 m)   Wt 174 lb (78.9 kg)   SpO2 98%   BMI 28.08 kg/m?  ?Wt Readings from Last 3 Encounters:  ?05/24/21 174 lb (78.9 kg)  ?04/26/21 172 lb (78 kg)  ?03/20/21 163 lb (73.9 kg)  ? ? ? ?There are no preventive care reminders to display for this patient. ? ? ?There are no preventive care reminders to display for this patient. ? ?Lab Results  ?Component Value Date  ? TSH 1.25 04/26/2021  ? ?Lab Results  ?Component Value Date  ? WBC 8.9 04/26/2021  ? HGB 15.0 04/26/2021  ? HCT 44.3 04/26/2021  ? MCV 87.9 04/26/2021  ? PLT 364 04/26/2021  ? ?Lab Results  ?Component Value Date  ? NA 138 04/26/2021  ? K 4.4 04/26/2021  ? CO2 28 04/26/2021  ? GLUCOSE 99 04/26/2021  ? BUN 17 04/26/2021  ? CREATININE 1.17 (H) 04/26/2021  ? BILITOT 0.5 04/26/2021  ? ALKPHOS 73 12/23/2013  ? AST 17 04/26/2021  ? ALT 12 04/26/2021  ? PROT 7.9 04/26/2021  ? ALBUMIN 4.8 12/23/2013  ? CALCIUM 9.7 04/26/2021  ? EGFR 57 (L) 04/26/2021  ? ?Lab Results  ?Component Value Date  ? CHOL 238 (H) 01/07/2019  ? ?Lab Results  ?Component Value Date  ? HDL 43 (L) 01/07/2019  ? ?Lab Results  ?Component Value Date  ? [place] 161 (H) 01/07/2019  ? ?Lab Results  ?Component Value Date  ? TRIG 182 (H) 01/07/2019  ? ?Lab Results  ?Component Value Date  ? CHOLHDL 5.5 (H) 01/07/2019  ? ?Lab Results  ?Component Value Date  ? HGBA1C 5.5 01/07/2019  ? ? ?  ?Assessment & Plan:  ? ?Problem List Items Addressed This Visit   ? ?  ? Cardiovascular and Mediastinum  ? Moderate tricuspid regurgitation  ?  Reviewed results with her.  We discussed getting a repeat echo in 1 to 2 years to follow-up on this and we also discussed the importance of really getting her blood pressure under good control. ?  ?  ? Relevant Medications  ?  hydrochlorothiazide (HYDRODIURIL) 25 MG tablet  ? Hypertension goal BP (blood pressure) < 130/80 - Primary  ?  Increase BP to 25 mg dialy. F/U in 3 mo .   ?  ?  ?  Relevant Medications  ? hydrochlorothiazide (HYDRODIURIL) 25 MG tablet  ? Ascending aorta dilatation (HCC)  ?  Reviewed results with her.  We discussed getting a repeat echo in 1 to 2 years to follow-up on this and we also discussed the importance of really getting her blood pressure under good control. ?  ?  ? Relevant Medications  ? hydrochlorothiazide (HYDRODIURIL) 25 MG tablet  ?  ? Endocrine  ? IFG (impaired fasting glucose)  ?  ? Other  ? Stress at work  ?  Discuss strategies to reduce her stress.  I am always open to medication if it really is something she would like to consider but right now she wants to hold off.  Continue to work with therapist. ?  ?  ? ?Other Visit Diagnoses   ? ? Palpitations      ? ?  ? ? ?Call [facility] for her last Pap smear.  She said she last went in December. ? ?Meds ordered this encounter  ?Medications  ? hydrochlorothiazide (HYDRODIURIL) 25 MG tablet  ?  Sig: Take 1 tablet (25 mg total) by mouth daily.  ?  Dispense:  90 tablet  ?  Refill:  3  ? ? ?Follow-up: Return in about 3 months (around 08/24/2021) for Hypertension.  ? ?I spent 30 minutes on the day of the encounter to include pre-visit record review, face-to-face time with the patient and post visit ordering of test. ? ? ?Beatrice Lecher, MD ?

## 2021-05-24 NOTE — Assessment & Plan Note (Signed)
Reviewed results with her.  We discussed getting a repeat echo in 1 to 2 years to follow-up on this and we also discussed the importance of really getting her blood pressure under good control. ?

## 2021-05-24 NOTE — Assessment & Plan Note (Signed)
Increase BP to 25 mg dialy. F/U in 3 mo .   ?

## 2021-05-24 NOTE — Assessment & Plan Note (Signed)
Discuss strategies to reduce her stress.  I am always open to medication if it really is something she would like to consider but right now she wants to hold off.  Continue to work with therapist. ?

## 2021-05-24 NOTE — Assessment & Plan Note (Signed)
Reviewed results with her.  We discussed getting a repeat echo in 1 to 2 years to follow-up on this and we also discussed the importance of really getting her blood pressure under good control. ?

## 2021-05-29 DIAGNOSIS — F411 Generalized anxiety disorder: Secondary | ICD-10-CM | POA: Diagnosis not present

## 2021-05-30 DIAGNOSIS — F411 Generalized anxiety disorder: Secondary | ICD-10-CM | POA: Diagnosis not present

## 2021-06-03 ENCOUNTER — Ambulatory Visit (INDEPENDENT_AMBULATORY_CARE_PROVIDER_SITE_OTHER): Payer: BC Managed Care – PPO | Admitting: Family Medicine

## 2021-06-03 ENCOUNTER — Encounter: Payer: Self-pay | Admitting: Family Medicine

## 2021-06-03 ENCOUNTER — Other Ambulatory Visit: Payer: Self-pay

## 2021-06-03 VITALS — BP 139/93 | HR 105 | Resp 18 | Ht 66.0 in | Wt 173.0 lb

## 2021-06-03 DIAGNOSIS — N3001 Acute cystitis with hematuria: Secondary | ICD-10-CM | POA: Diagnosis not present

## 2021-06-03 DIAGNOSIS — R300 Dysuria: Secondary | ICD-10-CM | POA: Diagnosis not present

## 2021-06-03 LAB — POCT URINALYSIS DIP (CLINITEK)
Bilirubin, UA: NEGATIVE
Glucose, UA: NEGATIVE mg/dL
Ketones, POC UA: NEGATIVE mg/dL
Nitrite, UA: POSITIVE — AB
POC PROTEIN,UA: >=300 — AB
Spec Grav, UA: >=1.03 — AB (ref 1.010–1.025)
Urobilinogen, UA: 0.2 E.U./dL
pH, UA: 6.5 (ref 5.0–8.0)

## 2021-06-03 MED ORDER — NITROFURANTOIN MONOHYD MACRO 100 MG PO CAPS: 100.0000 mg | ORAL_CAPSULE | Freq: Two times a day (BID) | ORAL | 0 refills | Status: DC

## 2021-06-03 MED ORDER — AMOXICILLIN-POT CLAVULANATE 875-125 MG PO TABS: 1.0000 | ORAL_TABLET | Freq: Two times a day (BID) | ORAL | 0 refills | Status: DC

## 2021-06-03 NOTE — Progress Notes (Addendum)
? ?Acute Office Visit ? ?Subjective:  ? ? Patient ID: Pamela Stevens, female    DOB: 1970-11-01, 51 y.o.   MRN: [id number] ? ?Chief Complaint  ?Patient presents with  ? Urinary Tract Infection  ?  Back pain, urine odor, urinary urgency and blood in urine for 3-4 days.   ? ? ?HPI ?Patient is in today for back pain, urinary urgency and odor as well as some blood in the urine for about 3 to 4 days. She has had some chills and fever.  Some back pain as well.  ? ?Past Medical History:  ?Diagnosis Date  ? Allergy   ? DIABETES MELLITUS, GESTATIONAL, INSULIN-DEPENDENT 03/05/2007  ? Qualifier: Diagnosis of  By: Esmeralda Arthur    ? Exercise-induced asthma   ? AS A CHILD  ? Gestational diabetes   ? Hypertension   ? PCOS (polycystic ovarian syndrome)   ? ? ?Past Surgical History:  ?Procedure Laterality Date  ? NO PAST SURGERIES    ? WISDOM TOOTH EXTRACTION    ? ? ?Family History  ?Problem Relation Age of Onset  ? Diabetes Mellitus II Mother   ? Hypertension Mother   ? Diabetes Mellitus II Father   ? Hypercholesterolemia Father   ? Hypertension Father   ? Heart disease Father   ? Hypertension Brother   ? Colon cancer Maternal Grandfather   ? Colon cancer Paternal Grandmother   ? Colon polyps Neg Hx   ? ? ?Social History  ? ?Socioeconomic History  ? Marital status: Married  ?  Spouse name: Not on file  ? Number of children: 2  ? Years of education: Not on file  ? Highest education level: Not on file  ?Occupational History  ? 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 Smoking status: Never  ? Smokeless tobacco: Never  ?Vaping Use  ? Vaping Use: Never used  ?Substance and Sexual Activity  ? Alcohol use: Yes  ?  Comment: one per month per pt  ? Drug use: Not Currently  ? Sexual activity: Yes  ?  Partners: Male  ?Other Topics Concern  ? Not on file  ?Social History Narrative  ? Some exercising. 1-2 caffeine drinks per day.   ? ?Social Determinants of Health  ? ?Financial Resource Strain: Not on file  ?Food  Insecurity: Not on file  ?Transportation Needs: Not on file  ?Physical Activity: Not on file  ?Stress: Not on file  ?Social Connections: Not on file  ?Intimate Partner Violence: Not on file  ? ? ?Outpatient Medications Prior to Visit  ?Medication Sig Dispense Refill  ? Chlorpheniramine Maleate (ALLERGY RELIEF PO) Take by mouth.    ? EVENING PRIMROSE OIL PO Take by mouth.    ? hydrochlorothiazide (HYDRODIURIL) 25 MG tablet Take 1 tablet (25 mg total) by mouth daily. 90 tablet 3  ? MAGNESIUM PO Take by mouth.    ? Multiple Vitamin (MULTIVITAMIN) capsule Take 1 capsule by mouth daily.    ? UNABLE TO FIND Med Name: Lactate  po    ? vitamin E 200 UNIT capsule Take 200 Units by mouth daily.    ? ?No facility-administered medications prior to visit.  ? ? ?Allergies  ?Allergen Reactions  ? Avapro [Irbesartan] Other (See Comments)  ?  Hair Loss  ? Clindamycin/Lincomycin Itching  ?  Hair loss  ? Lisinopril Cough  ? Sulfa Antibiotics Hives  ? ? ?Review of Systems ? ?   ?Objective:  ?  ?Physical  Exam ?Vitals and nursing note reviewed.  ?Constitutional:   ?   Appearance: She is well-developed.  ?HENT:  ?   Head: Normocephalic and atraumatic.  ?Pulmonary:  ?   Effort: Pulmonary effort is normal.  ?Abdominal:  ?   General: There is no distension.  ?   Palpations: Abdomen is soft.  ?   Tenderness: There is no abdominal tenderness.  ?Skin: ?   General: Skin is warm and dry.  ?Neurological:  ?   Mental Status: She is alert and oriented to person, place, and time.  ?Psychiatric:     ?   Mood and Affect: Mood normal.     ?   Behavior: Behavior normal.  ? ? ?BP (!) 139/93   Pulse (!) 105   Resp 18   Ht _0  (1.676 m)   Wt 173 lb (78.5 kg)   LMP 05/17/2021   SpO2 98%   BMI 27.92 kg/m?  ?Wt Readings from Last 3 Encounters:  ?06/03/21 173 lb (78.5 kg)  ?05/24/21 174 lb (78.9 kg)  ?04/26/21 172 lb (78 kg)  ? ? ?There are no preventive care reminders to display for this patient. ? ?There are no preventive care reminders to display  for this patient. ? ? ?Lab Results  ?Component Value Date  ? TSH 1.25 04/26/2021  ? ?Lab Results  ?Component Value Date  ? WBC 8.9 04/26/2021  ? HGB 15.0 04/26/2021  ? HCT 44.3 04/26/2021  ? MCV 87.9 04/26/2021  ? PLT 364 04/26/2021  ? ?Lab Results  ?Component Value Date  ? NA 138 04/26/2021  ? K 4.4 04/26/2021  ? CO2 28 04/26/2021  ? GLUCOSE 99 04/26/2021  ? BUN 17 04/26/2021  ? CREATININE 1.17 (H) 04/26/2021  ? BILITOT 0.5 04/26/2021  ? ALKPHOS 73 12/23/2013  ? AST 17 04/26/2021  ? ALT 12 04/26/2021  ? PROT 7.9 04/26/2021  ? ALBUMIN 4.8 12/23/2013  ? CALCIUM 9.7 04/26/2021  ? EGFR 57 (L) 04/26/2021  ? ?Lab Results  ?Component Value Date  ? CHOL 238 (H) 01/07/2019  ? ?Lab Results  ?Component Value Date  ? HDL 43 (L) 01/07/2019  ? ?Lab Results  ?Component Value Date  ? [place] 161 (H) 01/07/2019  ? ?Lab Results  ?Component Value Date  ? TRIG 182 (H) 01/07/2019  ? ?Lab Results  ?Component Value Date  ? CHOLHDL 5.5 (H) 01/07/2019  ? ?Lab Results  ?Component Value Date  ? HGBA1C 5.5 01/07/2019  ? ? ?   ?Assessment & Plan:  ? ?Problem List Items Addressed This Visit   ?None ?Visit Diagnoses   ? ? Dysuria    -  Primary  ? Relevant Orders  ? POCT URINALYSIS DIP (CLINITEK) (Completed)  ? Urine Culture  ? Acute cystitis with hematuria      ? ?  ? ?Cystitis-we will treat with Macrobid.  No recent antibiotic use.  We will go ahead and send for culture since she does report some back pain and low-grade fevers_make sure that its not a resistant bacteria.  We will call with results once available.  Symptomatic care.  Handout provided. ? ?COVID was on backorder at the pharmacy so new prescription sent for Augmentin. ? ?Meds ordered this encounter  ?Medications  ? nitrofurantoin, macrocrystal-monohydrate, (MACROBID) 100 MG capsule  ?  Sig: Take 1 capsule (100 mg total) by mouth 2 (two) times daily.  ?  Dispense:  10 capsule  ?  Refill:  0  ? ? ? ?Beatrice Lecher,  MD ? ?

## 2021-06-03 NOTE — Addendum Note (Signed)
Addended by: Nani Gasser D on: 06/03/2021 04:36 PM ? ? Modules accepted: Orders ? ?

## 2021-06-04 ENCOUNTER — Other Ambulatory Visit: Payer: Self-pay | Admitting: Family Medicine

## 2021-06-05 ENCOUNTER — Encounter: Payer: Self-pay | Admitting: Family Medicine

## 2021-06-05 LAB — URINE CULTURE
MICRO NUMBER:: 13126470
SPECIMEN QUALITY:: ADEQUATE

## 2021-06-05 NOTE — Progress Notes (Signed)
HI Breon,  ?Urine culture came back positive for E. coli which is a very common bladder infection bacteria.  The antibiotic that I put you on called nitrofurantoin should take care of this based on the cultures and sensitivities report.

## 2021-06-10 DIAGNOSIS — F411 Generalized anxiety disorder: Secondary | ICD-10-CM | POA: Diagnosis not present

## 2021-06-13 DIAGNOSIS — F411 Generalized anxiety disorder: Secondary | ICD-10-CM | POA: Diagnosis not present

## 2021-06-17 DIAGNOSIS — F411 Generalized anxiety disorder: Secondary | ICD-10-CM | POA: Diagnosis not present

## 2021-06-18 DIAGNOSIS — F411 Generalized anxiety disorder: Secondary | ICD-10-CM | POA: Diagnosis not present

## 2021-06-21 ENCOUNTER — Encounter: Payer: Self-pay | Admitting: Family Medicine

## 2021-06-21 NOTE — Progress Notes (Signed)
Negative for intraepithelial lesion or malignancy.  

## 2021-06-24 DIAGNOSIS — F411 Generalized anxiety disorder: Secondary | ICD-10-CM | POA: Diagnosis not present

## 2021-06-25 DIAGNOSIS — F411 Generalized anxiety disorder: Secondary | ICD-10-CM | POA: Diagnosis not present

## 2021-06-27 DIAGNOSIS — F411 Generalized anxiety disorder: Secondary | ICD-10-CM | POA: Diagnosis not present

## 2021-07-01 DIAGNOSIS — F411 Generalized anxiety disorder: Secondary | ICD-10-CM | POA: Diagnosis not present

## 2021-07-02 DIAGNOSIS — F411 Generalized anxiety disorder: Secondary | ICD-10-CM | POA: Diagnosis not present

## 2021-07-11 DIAGNOSIS — F411 Generalized anxiety disorder: Secondary | ICD-10-CM | POA: Diagnosis not present

## 2021-07-15 DIAGNOSIS — F411 Generalized anxiety disorder: Secondary | ICD-10-CM | POA: Diagnosis not present

## 2021-07-16 DIAGNOSIS — F411 Generalized anxiety disorder: Secondary | ICD-10-CM | POA: Diagnosis not present

## 2021-07-18 DIAGNOSIS — F411 Generalized anxiety disorder: Secondary | ICD-10-CM | POA: Diagnosis not present

## 2021-08-13 DIAGNOSIS — F411 Generalized anxiety disorder: Secondary | ICD-10-CM | POA: Diagnosis not present

## 2021-08-15 DIAGNOSIS — F411 Generalized anxiety disorder: Secondary | ICD-10-CM | POA: Diagnosis not present

## 2021-08-21 DIAGNOSIS — F411 Generalized anxiety disorder: Secondary | ICD-10-CM | POA: Diagnosis not present

## 2021-08-30 ENCOUNTER — Ambulatory Visit (INDEPENDENT_AMBULATORY_CARE_PROVIDER_SITE_OTHER): Payer: BC Managed Care – PPO | Admitting: Family Medicine

## 2021-08-30 ENCOUNTER — Encounter: Payer: Self-pay | Admitting: Family Medicine

## 2021-08-30 VITALS — BP 125/77 | HR 76 | Resp 18 | Ht 66.0 in | Wt 174.0 lb

## 2021-08-30 DIAGNOSIS — Z23 Encounter for immunization: Secondary | ICD-10-CM | POA: Diagnosis not present

## 2021-08-30 DIAGNOSIS — R7989 Other specified abnormal findings of blood chemistry: Secondary | ICD-10-CM | POA: Diagnosis not present

## 2021-08-30 DIAGNOSIS — I10 Essential (primary) hypertension: Secondary | ICD-10-CM | POA: Diagnosis not present

## 2021-08-30 DIAGNOSIS — Z566 Other physical and mental strain related to work: Secondary | ICD-10-CM

## 2021-08-30 DIAGNOSIS — I77810 Thoracic aortic ectasia: Secondary | ICD-10-CM | POA: Diagnosis not present

## 2021-08-30 DIAGNOSIS — G479 Sleep disorder, unspecified: Secondary | ICD-10-CM

## 2021-08-30 MED ORDER — LOSARTAN POTASSIUM-HCTZ 50-12.5 MG PO TABS: 1.0000 | ORAL_TABLET | Freq: Every day | ORAL | 0 refills | Status: DC

## 2021-08-30 NOTE — Patient Instructions (Signed)
Can also try valerian root tea for sleep.  They also make capsules.

## 2021-08-30 NOTE — Assessment & Plan Note (Signed)
Just discussed the importance of really keeping her blood pressure maximally controlled.  And needs to be consistently under 130 on the top and under 85 on the bottom.

## 2021-08-30 NOTE — Assessment & Plan Note (Addendum)
Repeat blood pressure looks much better.  We discussed switching to losartan/HCTZ for better blood pressure control.  Follow-up in a few months.  She has not been able to exercise as consistently as she would like because of the long work hours.

## 2021-08-30 NOTE — Assessment & Plan Note (Signed)
Unfortunately still really struggling with work stress.  She does have a therapist.  We gust maybe even working with a life coach could be helpful.  Also deciding whether or not to stay in this job long-term if it is creating a lot of increased stress and affecting her physical health.

## 2021-08-30 NOTE — Progress Notes (Signed)
Established Patient Office Visit  Subjective   Patient ID: Pamela Stevens, female    DOB: 1970-04-15  Age: 51 y.o. MRN: [id number]  Chief Complaint  Patient presents with   Hypertension    Follow up. Patient states BP at home has been running around mid 120's/mid 80's.    Low Energy     Since 05/01/2021.     HPI  Hypertension- Pt denies chest pain, SOB, dizziness, or heart palpitations.  Taking meds as directed w/o problems.  Denies medication side effects.  Not currently exercising regularly because of long work hours.   She has had a significant number of personal stressors including sick loved ones, family members that have passed away in the last year and work where she got put into a new position without a lot of skill set in that position and is also just been very stressful and overwhelming and she has been working really long hours.  She says normally she is pretty steady emotionally but even recently has had a few tearful moments which is not like her.  She is currently engaged in therapy every 1 to 2 weeks.  It was originally started for marital therapy but in the last several sessions she has been sharing her thoughts and feelings about work.    ROS    Objective:     BP 125/77   Pulse 76   Resp 18   Ht 5\' 6"  (1.676 m)   Wt 174 lb (78.9 kg)   LMP 08/23/2021   SpO2 99%   BMI 28.08 kg/m    Physical Exam Vitals and nursing note reviewed.  Constitutional:      Appearance: She is well-developed.  HENT:     Head: Normocephalic and atraumatic.  Cardiovascular:     Rate and Rhythm: Normal rate and regular rhythm.     Heart sounds: Normal heart sounds.  Pulmonary:     Effort: Pulmonary effort is normal.     Breath sounds: Normal breath sounds.  Skin:    General: Skin is warm and dry.  Neurological:     Mental Status: She is alert and oriented to person, place, and time.  Psychiatric:        Behavior: Behavior normal.      No results found for  any visits on 08/30/21.    The 10-year ASCVD risk score (Arnett DK, et al., 2019) is: 2.7%    Assessment & Plan:   Problem List Items Addressed This Visit       Cardiovascular and Mediastinum   Hypertension goal BP (blood pressure) < 130/80 - Primary    Repeat blood pressure looks much better.  We discussed switching to losartan/HCTZ for better blood pressure control.  Follow-up in a few months.  She has not been able to exercise as consistently as she would like because of the long work hours.      Relevant Medications   losartan-hydrochlorothiazide (HYZAAR) 50-12.5 MG tablet   Other Relevant Orders   BASIC METABOLIC PANEL WITH GFR   Ascending aorta dilatation (HCC)    Just discussed the importance of really keeping her blood pressure maximally controlled.  And needs to be consistently under 130 on the top and under 85 on the bottom.      Relevant Medications   losartan-hydrochlorothiazide (HYZAAR) 50-12.5 MG tablet     Other   Stress at work    Unfortunately still really struggling with work stress.  She does have a therapist.  We gust maybe even working with a life coach could be helpful.  Also deciding whether or not to stay in this job long-term if it is creating a lot of increased stress and affecting her physical health.      Other Visit Diagnoses     Elevated serum creatinine       Relevant Orders   BASIC METABOLIC PANEL WITH GFR   Immunization due       Relevant Orders   Varicella-zoster vaccine IM (Shingrix) (Completed)   Sleep difficulties          Sleep difficulties-recommended a trial of valerian root tea.  Melatonin can be helpful but she has to time it pretty precisely and so sometimes forgets to take it.  Return in about 3 months (around 11/30/2021) for Nurse visit for BP .    Beatrice Lecher, MD

## 2021-08-31 LAB — BASIC METABOLIC PANEL WITH GFR
BUN/Creatinine Ratio: 18 (calc) (ref 6–22)
BUN: 20 mg/dL (ref 7–25)
CO2: 30 mmol/L (ref 20–32)
Calcium: 10 mg/dL (ref 8.6–10.4)
Chloride: 99 mmol/L (ref 98–110)
Creat: 1.14 mg/dL — ABNORMAL HIGH (ref 0.50–1.03)
Glucose, Bld: 85 mg/dL (ref 65–99)
Potassium: 4.1 mmol/L (ref 3.5–5.3)
Sodium: 139 mmol/L (ref 135–146)
eGFR: 59 mL/min/{1.73_m2} — ABNORMAL LOW (ref >=60)

## 2021-09-02 ENCOUNTER — Encounter: Payer: Self-pay | Admitting: Family Medicine

## 2021-09-02 DIAGNOSIS — R7989 Other specified abnormal findings of blood chemistry: Secondary | ICD-10-CM

## 2021-09-02 NOTE — Progress Notes (Signed)
Hi Alazay,  The kidney function is similar to what it was 4 months ago.  I would like to get a renal ultrasound just to make sure that everything looks okay.  If you are okay with this please let me know.

## 2021-09-04 ENCOUNTER — Ambulatory Visit (INDEPENDENT_AMBULATORY_CARE_PROVIDER_SITE_OTHER): Payer: BC Managed Care – PPO

## 2021-09-04 DIAGNOSIS — R7989 Other specified abnormal findings of blood chemistry: Secondary | ICD-10-CM

## 2021-09-04 DIAGNOSIS — R93429 Abnormal radiologic findings on diagnostic imaging of unspecified kidney: Secondary | ICD-10-CM

## 2021-09-04 DIAGNOSIS — N1330 Unspecified hydronephrosis: Secondary | ICD-10-CM | POA: Diagnosis not present

## 2021-09-05 ENCOUNTER — Encounter: Payer: Self-pay | Admitting: Family Medicine

## 2021-09-05 NOTE — Progress Notes (Signed)
HI Pamela Stevens,no abnormal lesion of cyst on your kidneys but they did see some mild changes.  This can come from BP  not being maximally controlled but other causes as well. L.   I would like to do some extra labs nextin the next couple of weeks if she is OK with that. Order splaced.

## 2021-09-06 ENCOUNTER — Other Ambulatory Visit: Payer: Self-pay | Admitting: Family Medicine

## 2021-09-06 DIAGNOSIS — R93429 Abnormal radiologic findings on diagnostic imaging of unspecified kidney: Secondary | ICD-10-CM | POA: Diagnosis not present

## 2021-09-06 DIAGNOSIS — R7989 Other specified abnormal findings of blood chemistry: Secondary | ICD-10-CM | POA: Diagnosis not present

## 2021-09-09 ENCOUNTER — Other Ambulatory Visit: Payer: Self-pay | Admitting: Family Medicine

## 2021-09-09 DIAGNOSIS — R7989 Other specified abnormal findings of blood chemistry: Secondary | ICD-10-CM | POA: Diagnosis not present

## 2021-09-09 DIAGNOSIS — R93429 Abnormal radiologic findings on diagnostic imaging of unspecified kidney: Secondary | ICD-10-CM | POA: Diagnosis not present

## 2021-09-09 LAB — CBC WITH DIFFERENTIAL/PLATELET
Absolute Monocytes: 482 cells/uL (ref 200–950)
Basophils Absolute: 86 cells/uL (ref 0–200)
Basophils Relative: 1 %
Eosinophils Absolute: 163 cells/uL (ref 15–500)
Eosinophils Relative: 1.9 %
HCT: 43.8 % (ref 35.0–45.0)
Hemoglobin: 14.9 g/dL (ref 11.7–15.5)
Lymphs Abs: 2692 cells/uL (ref 850–3900)
MCH: 29.7 pg (ref 27.0–33.0)
MCHC: 34 g/dL (ref 32.0–36.0)
MCV: 87.4 fL (ref 80.0–100.0)
MPV: 10.2 fL (ref 7.5–12.5)
Monocytes Relative: 5.6 %
Neutro Abs: 5177 cells/uL (ref 1500–7800)
Neutrophils Relative %: 60.2 %
Platelets: 312 10*3/uL (ref 140–400)
RBC: 5.01 10*6/uL (ref 3.80–5.10)
RDW: 12.8 % (ref 11.0–15.0)
Total Lymphocyte: 31.3 %
WBC: 8.6 10*3/uL (ref 3.8–10.8)

## 2021-09-09 LAB — URINALYSIS, MICROSCOPIC ONLY
Hyaline Cast: NONE SEEN /LPF
RBC / HPF: NONE SEEN /HPF (ref 0–2)

## 2021-09-09 LAB — PROTEIN ELECTROPHORESIS, SERUM
Albumin ELP: 4.5 g/dL (ref 3.8–4.8)
Alpha 1: 0.3 g/dL (ref 0.2–0.3)
Alpha 2: 0.7 g/dL (ref 0.5–0.9)
Beta 2: 0.4 g/dL (ref 0.2–0.5)
Beta Globulin: 0.5 g/dL (ref 0.4–0.6)
Gamma Globulin: 1.2 g/dL (ref 0.8–1.7)
Total Protein: 7.5 g/dL (ref 6.1–8.1)

## 2021-09-09 LAB — ANA: Anti Nuclear Antibody (ANA): POSITIVE — AB

## 2021-09-09 LAB — MICROALBUMIN / CREATININE URINE RATIO
Creatinine, Urine: 172 mg/dL (ref 20–275)
Microalb Creat Ratio: 9 mcg/mg creat (ref <30)
Microalb, Ur: 1.6 mg/dL

## 2021-09-09 LAB — C-REACTIVE PROTEIN: CRP: 4.3 mg/L (ref <8.0)

## 2021-09-09 LAB — SEDIMENTATION RATE: Sed Rate: 6 mm/h (ref 0–20)

## 2021-09-09 LAB — ANTI-NUCLEAR AB-TITER (ANA TITER): ANA Titer 1: 1:80 {titer} — ABNORMAL HIGH

## 2021-09-09 LAB — HEPATITIS C ANTIBODY
Hepatitis C Ab: NONREACTIVE
SIGNAL TO CUT-OFF: 0.1 (ref <1.00)

## 2021-09-09 LAB — HIV ANTIBODY (ROUTINE TESTING W REFLEX): HIV 1&2 Ab, 4th Generation: NONREACTIVE

## 2021-09-09 NOTE — Progress Notes (Signed)
Please check to see if UA was a clean catch or just a regular urine

## 2021-09-11 ENCOUNTER — Encounter: Payer: Self-pay | Admitting: Family Medicine

## 2021-09-11 LAB — PROTEIN ELECTROPHORESIS,W/TOTAL PROTEIN AND RFLX TO IFE,URINE
" PROTEIN/CREATININE RATIO": 0.122 mg/mg{creat}
Albumin: 49 %
Alpha-1-Globulin, U: 4 %
Alpha-2-Globulin, U: 12 %
Beta Globulin, U: 20 %
Creatinine, 24H Ur: 2.25 g/(24.h) — ABNORMAL HIGH (ref 0.50–2.15)
Gamma Globulin, U: 15 %
PROTEIN/CREATININE RATIO: 122 mg/g{creat}
Protein, 24H Urine: 275 mg/(24.h) — ABNORMAL HIGH (ref 0–149)

## 2021-09-11 NOTE — Progress Notes (Signed)
Hi Asuka, I am not concerned about the urine especially if it was not a clean-catch.  We were really looking for whole red blood cells and you did not have any which is great.  The ANA titer came back.  Your ratio was 1:80 which is consistent with an elevated antibody level.  General inflammatory markers are normal although so this is reassuring.  Having an elevated ANA does not make a diagnosis but it certainly increases our suspicion.  Thus, I would like to refer you to a nephrologist which is a kidney specialist based on your kidney function.  We will send them a copy of the ultrasound as well as these recent labs so that we can see if they want to do any further work-up or just monitor.  Please let me know if you have a preference for provider or location.  We can go ahead and start working on referral even though you are out of town.  As it will likely take at least a month or 2 to get the appointment.

## 2021-09-12 MED ORDER — NITROFURANTOIN MONOHYD MACRO 100 MG PO CAPS: 100.0000 mg | ORAL_CAPSULE | Freq: Two times a day (BID) | ORAL | 0 refills | Status: DC

## 2021-09-18 ENCOUNTER — Encounter: Payer: Self-pay | Admitting: Family Medicine

## 2021-09-18 DIAGNOSIS — R7989 Other specified abnormal findings of blood chemistry: Secondary | ICD-10-CM

## 2021-09-18 DIAGNOSIS — R768 Other specified abnormal immunological findings in serum: Secondary | ICD-10-CM

## 2021-09-18 NOTE — Progress Notes (Signed)
No sign of multiple myeloma which is great.  Did you want [place] to hold on the referral to nephrology?  I am not sure if we heard back if you had a preference for provider or location.

## 2021-09-18 NOTE — Telephone Encounter (Signed)
Orders Placed This Encounter  Procedures  . Ambulatory referral to Nephrology    Referral Priority:   Routine    Referral Type:   Consultation    Referral Reason:   Specialty Services Required    Requested Specialty:   Nephrology    Number of Visits Requested:   1     

## 2021-10-08 DIAGNOSIS — F411 Generalized anxiety disorder: Secondary | ICD-10-CM | POA: Diagnosis not present

## 2021-10-09 DIAGNOSIS — F411 Generalized anxiety disorder: Secondary | ICD-10-CM | POA: Diagnosis not present

## 2021-10-10 DIAGNOSIS — Z6828 Body mass index (BMI) 28.0-28.9, adult: Secondary | ICD-10-CM | POA: Diagnosis not present

## 2021-10-10 DIAGNOSIS — I10 Essential (primary) hypertension: Secondary | ICD-10-CM | POA: Diagnosis not present

## 2021-10-10 DIAGNOSIS — R809 Proteinuria, unspecified: Secondary | ICD-10-CM | POA: Diagnosis not present

## 2021-10-10 DIAGNOSIS — N1831 Chronic kidney disease, stage 3a: Secondary | ICD-10-CM | POA: Diagnosis not present

## 2021-10-21 DIAGNOSIS — F411 Generalized anxiety disorder: Secondary | ICD-10-CM | POA: Diagnosis not present

## 2021-10-22 DIAGNOSIS — F411 Generalized anxiety disorder: Secondary | ICD-10-CM | POA: Diagnosis not present

## 2021-10-24 DIAGNOSIS — F411 Generalized anxiety disorder: Secondary | ICD-10-CM | POA: Diagnosis not present

## 2021-11-04 DIAGNOSIS — F411 Generalized anxiety disorder: Secondary | ICD-10-CM | POA: Diagnosis not present

## 2021-11-05 ENCOUNTER — Encounter: Payer: Self-pay | Admitting: Family Medicine

## 2021-11-05 ENCOUNTER — Ambulatory Visit (INDEPENDENT_AMBULATORY_CARE_PROVIDER_SITE_OTHER): Payer: BC Managed Care – PPO | Admitting: Family Medicine

## 2021-11-05 VITALS — BP 142/88 | HR 88 | Ht 66.0 in | Wt 180.0 lb

## 2021-11-05 DIAGNOSIS — R0789 Other chest pain: Secondary | ICD-10-CM

## 2021-11-05 DIAGNOSIS — M79662 Pain in left lower leg: Secondary | ICD-10-CM

## 2021-11-05 DIAGNOSIS — F411 Generalized anxiety disorder: Secondary | ICD-10-CM | POA: Diagnosis not present

## 2021-11-05 DIAGNOSIS — G4709 Other insomnia: Secondary | ICD-10-CM | POA: Diagnosis not present

## 2021-11-05 DIAGNOSIS — R079 Chest pain, unspecified: Secondary | ICD-10-CM | POA: Diagnosis not present

## 2021-11-05 DIAGNOSIS — R0602 Shortness of breath: Secondary | ICD-10-CM

## 2021-11-05 MED ORDER — TRAZODONE HCL 50 MG PO TABS: 25.0000 mg | ORAL_TABLET | Freq: Every evening | ORAL | 0 refills | Status: DC | PRN

## 2021-11-05 NOTE — Progress Notes (Signed)
Acute Office Visit  Subjective:     Patient ID: Pamela Stevens, female    DOB: 06/16/1970, 51 y.o.   MRN: [id number]  Chief Complaint  Patient presents with   Chest Pain    Pt states she has  prolong chest pain and tightness left arm numbness    HPI Patient is in today for several symptoms.  She says over the last 5 days she is only been getting about 4 hours of sleep at night.  She has tried melatonin she has tried Benadryl nothing seems to really help.  She even tried NyQuil.  He also reports she has been getting some intermittent chest pain that is midsternal discomfort that she says feels like a weight or a tightness.  It is often associated with a feeling of shortness of breath.  The last few days she has been experiencing some tingling in the posterior left shoulder and then last night felt numbness and tingling going down into her left arm.  She feels like her hands are like tight and swollen but has not noticed any swelling in her ankles or feet.  Feels like her breathing has been more shallow like it is really difficult to take a deep breath.  She is also had some muscle fatigue in her legs.  As her neck has been more stiff in general and she has been getting more frequent headaches because of that.  Reports lightheadedness intermittently.  Not necessarily associated with the chest pain.  Note, she did speak with her therapist about the symptoms and her therapist felt like this a lot of this was anxiety triggered.  ROS      Objective:    BP (!) 142/88   Pulse 88   Ht 5\' 6"  (1.676 m)   Wt 180 lb (81.6 kg)   SpO2 99%   BMI 29.05 kg/m    Physical Exam Vitals and nursing note reviewed.  Constitutional:      Appearance: She is well-developed.  HENT:     Head: Normocephalic and atraumatic.  Eyes:     Conjunctiva/sclera: Conjunctivae normal.  Cardiovascular:     Rate and Rhythm: Normal rate and regular rhythm.     Heart sounds: Normal heart sounds.   Pulmonary:     Effort: Pulmonary effort is normal.     Breath sounds: Normal breath sounds.  Skin:    General: Skin is warm and dry.     Coloration: Skin is not pale.  Neurological:     Mental Status: She is alert and oriented to person, place, and time.  Psychiatric:        Behavior: Behavior normal.     No results found for any visits on 11/05/21.      Assessment & Plan:   Problem List Items Addressed This Visit   None Visit Diagnoses     Chest pain, unspecified type    -  Primary   Relevant Orders   EKG 12-Lead   TSH   CK (Creatine Kinase)   BASIC METABOLIC PANEL WITH GFR   DG Chest 2 View   Other insomnia       Relevant Medications   traZODone (DESYREL) 50 MG tablet   Atypical chest pain       Relevant Orders   TSH   CK (Creatine Kinase)   BASIC METABOLIC PANEL WITH GFR   Pain of left calf       Relevant Orders   TSH   CK (Creatine  Kinase)   BASIC METABOLIC PANEL WITH GFR   SOB (shortness of breath)       Relevant Orders   DG Chest 2 View       Atypical chest pain-EKG today shows rate of 81 bpm, normal sinus rhythm.  Possible left atrial enlargement but no significant change from previous EKG done in 2020. Cardiac is less likely. Consider GI related.  No problems swallowing.  Anxiety is still certainly on the differential.  Pain in left calf its been going on for months at this point so very unlikely to be DVT.  Could be related to musculoskeletal strain though she is not able to reproduce the pain with movement or or exercise.  Shortness of breath-we will get updated chest x-ray.  She reports last chest x-ray was probably over a year ago I do not have 1 within our system on file.  It certainly could be stress or anxiety related.  Also consider other underlying cause such as silent GERD etc.    Meds ordered this encounter  Medications   traZODone (DESYREL) 50 MG tablet    Sig: Take 0.5-1 tablets (25-50 mg total) by mouth at bedtime as needed for  sleep.    Dispense:  30 tablet    Refill:  0    Return in about 3 weeks (around 11/26/2021) for chest pain and sleep issues.  Nani Gasser, MD

## 2021-11-06 LAB — CK: Total CK: 53 U/L (ref 29–143)

## 2021-11-06 LAB — BASIC METABOLIC PANEL WITH GFR
BUN/Creatinine Ratio: 15 (calc) (ref 6–22)
BUN: 16 mg/dL (ref 7–25)
CO2: 28 mmol/L (ref 20–32)
Calcium: 9.7 mg/dL (ref 8.6–10.4)
Chloride: 100 mmol/L (ref 98–110)
Creat: 1.05 mg/dL — ABNORMAL HIGH (ref 0.50–1.03)
Glucose, Bld: 117 mg/dL — ABNORMAL HIGH (ref 65–99)
Potassium: 4.2 mmol/L (ref 3.5–5.3)
Sodium: 137 mmol/L (ref 135–146)
eGFR: 65 mL/min/{1.73_m2} (ref >=60)

## 2021-11-06 LAB — TSH: TSH: 1.12 mIU/L

## 2021-11-06 NOTE — Progress Notes (Signed)
Thyroid is normal.  Renal function is stable.

## 2021-11-07 DIAGNOSIS — F411 Generalized anxiety disorder: Secondary | ICD-10-CM | POA: Diagnosis not present

## 2021-11-18 DIAGNOSIS — F411 Generalized anxiety disorder: Secondary | ICD-10-CM | POA: Diagnosis not present

## 2021-11-21 DIAGNOSIS — F411 Generalized anxiety disorder: Secondary | ICD-10-CM | POA: Diagnosis not present

## 2021-11-26 ENCOUNTER — Other Ambulatory Visit: Payer: Self-pay | Admitting: Family Medicine

## 2021-11-26 ENCOUNTER — Ambulatory Visit (INDEPENDENT_AMBULATORY_CARE_PROVIDER_SITE_OTHER): Payer: BC Managed Care – PPO | Admitting: Family Medicine

## 2021-11-26 VITALS — BP 147/93 | HR 80 | Ht 66.0 in | Wt 179.0 lb

## 2021-11-26 DIAGNOSIS — I10 Essential (primary) hypertension: Secondary | ICD-10-CM | POA: Diagnosis not present

## 2021-11-26 DIAGNOSIS — M5030 Other cervical disc degeneration, unspecified cervical region: Secondary | ICD-10-CM

## 2021-11-26 DIAGNOSIS — R0789 Other chest pain: Secondary | ICD-10-CM | POA: Diagnosis not present

## 2021-11-26 DIAGNOSIS — G4709 Other insomnia: Secondary | ICD-10-CM | POA: Diagnosis not present

## 2021-11-26 DIAGNOSIS — M7989 Other specified soft tissue disorders: Secondary | ICD-10-CM

## 2021-11-26 DIAGNOSIS — Z566 Other physical and mental strain related to work: Secondary | ICD-10-CM

## 2021-11-26 DIAGNOSIS — R519 Headache, unspecified: Secondary | ICD-10-CM

## 2021-11-26 LAB — POCT GLYCOSYLATED HEMOGLOBIN (HGB A1C): Hemoglobin A1C: 5.7 % — AB (ref 4.0–5.6)

## 2021-11-26 MED ORDER — HYDROCHLOROTHIAZIDE 25 MG PO TABS: 25.0000 mg | ORAL_TABLET | Freq: Every day | ORAL | 1 refills | Status: DC

## 2021-11-26 MED ORDER — TOPIRAMATE 25 MG PO TABS: 25.0000 mg | ORAL_TABLET | Freq: Every day | ORAL | 1 refills | Status: DC

## 2021-11-26 MED ORDER — AMLODIPINE BESYLATE 5 MG PO TABS: 5.0000 mg | ORAL_TABLET | Freq: Every day | ORAL | 1 refills | Status: DC

## 2021-11-26 NOTE — Assessment & Plan Note (Signed)
Consider formal PT for neck.  I do think it could be triggered contributing to some of her headaches.

## 2021-11-26 NOTE — Assessment & Plan Note (Signed)
Like to see if maybe adjusting the blood pressure medication is helpful if not consider further treatment and physical therapy even for her neck it sounds like she is getting a lot of headaches that are being triggered at the base of her head and then radiating upward.  Getting some numbness and tingling down into the shoulder and neck again I suspect she may have some disc issue going on that which could certainly be contributing to the headaches as well.

## 2021-11-26 NOTE — Progress Notes (Signed)
Established Patient Office Visit  Subjective   Patient ID: Pamela Stevens, female    DOB: Oct 09, 1970  Age: 51 y.o. MRN: [id number]  Chief Complaint  Patient presents with   Follow-up    HPI F/U BP -starting to notice hair loss with the losartan HCTZ and she still having difficulty getting her diastolic blood pressure lower.  Also reports intermittent dizziness.  Also notes hair has been thinning since being back on the medication.  She really wants to try to get back into exercising which has been hard with feeling tired.  Still experiencing some occasional chest pain but overall it seems to be improved.  Would like to discuss sleep issues.  She feels like her trazodone was not helpful.  Was taking it an hour and a half before bedtime.  She had a few nights where it may be helped a little bit but other nights we did not seem to do anything at all.  She actually got out her weighted blanket about 4 days ago and has been sleeping with at night and that actually seems to be even more helpful than the medication.  She is still having frequent headaches and would like to consider going back on her topiramate which she has used in the past and found it helpful.  She reports that her hands and fingers feel a little bit swollen no recent changes.  She did eat a few things over the weekend that she would normally eat with the holiday weekend.    ROS    Objective:     BP (!) 147/93   Pulse 80   Ht 5\' 6"  (1.676 m)   Wt 179 lb (81.2 kg)   SpO2 99%   BMI 28.89 kg/m    Physical Exam Vitals and nursing note reviewed.  Constitutional:      Appearance: She is well-developed.  HENT:     Head: Normocephalic and atraumatic.  Cardiovascular:     Rate and Rhythm: Normal rate and regular rhythm.     Heart sounds: Normal heart sounds.  Pulmonary:     Effort: Pulmonary effort is normal.     Breath sounds: Normal breath sounds.  Skin:    General: Skin is warm and dry.  Neurological:      Mental Status: She is alert and oriented to person, place, and time.  Psychiatric:        Behavior: Behavior normal.      Results for orders placed or performed in visit on 11/26/21  POCT HgB A1C  Result Value Ref Range   Hemoglobin A1C 5.7 (A) 4.0 - 5.6 %   HbA1c POC (<> result, manual entry)     HbA1c, POC (prediabetic range)     HbA1c, POC (controlled diabetic range)        The 10-year ASCVD risk score (Arnett DK, et al., 2019) is: 3.8%    Assessment & Plan:   Problem List Items Addressed This Visit       Cardiovascular and Mediastinum   Hypertension goal BP (blood pressure) < 130/80 - Primary    Repeat blood pressure did improve but still not at goal.  Though home blood pressures a little bit better though still struggling with elevated diastolics.  We will go ahead and discontinue the losartan since she is also noting some hair loss and switch back to HCTZ 25 mg and add amlodipine 5 mg which she was previously taking.      Relevant Medications  amLODipine (NORVASC) 5 MG tablet   hydrochlorothiazide (HYDRODIURIL) 25 MG tablet   Other Relevant Orders   POCT HgB A1C (Completed)     Musculoskeletal and Integument   Degenerative disc disease, cervical (Chronic)    Consider formal PT for neck.  I do think it could be triggered contributing to some of her headaches.        Other   Stress at work    Discussed the possibility of starting medication to help with stress.  She is already working with a Scientist, forensic.  Right now she wants to hold off on medication and try to work on some of these other things first and then maybe circle back if needed.      Other insomnia    Now getting more benefit from the weighted blanket than she was with the trazodone so we will discontinue the trazodone for now.  We will work on getting the blood pressure better adjusted and restarted the Topamax to help with the headaches and see if sleep is also continuing to improve  especially as she tries to start exercising.      Relevant Orders   POCT HgB A1C (Completed)   Frequent headaches    Like to see if maybe adjusting the blood pressure medication is helpful if not consider further treatment and physical therapy even for her neck it sounds like she is getting a lot of headaches that are being triggered at the base of her head and then radiating upward.  Getting some numbness and tingling down into the shoulder and neck again I suspect she may have some disc issue going on that which could certainly be contributing to the headaches as well.      Relevant Medications   amLODipine (NORVASC) 5 MG tablet   topiramate (TOPAMAX) 25 MG tablet   Other Relevant Orders   POCT HgB A1C (Completed)   Other Visit Diagnoses     Atypical chest pain       Relevant Orders   POCT HgB A1C (Completed)   Finger swelling          Finger swelling-suspect may be related to salt intake over the weekend she has not noticed any increased edema in her feet which is reassuring.  Return in about 6 weeks (around 01/07/2022).    Nani Gasser, MD

## 2021-11-26 NOTE — Assessment & Plan Note (Signed)
Now getting more benefit from the weighted blanket than she was with the trazodone so we will discontinue the trazodone for now.  We will work on getting the blood pressure better adjusted and restarted the Topamax to help with the headaches and see if sleep is also continuing to improve especially as she tries to start exercising.

## 2021-11-26 NOTE — Assessment & Plan Note (Signed)
Repeat blood pressure did improve but still not at goal.  Though home blood pressures a little bit better though still struggling with elevated diastolics.  We will go ahead and discontinue the losartan since she is also noting some hair loss and switch back to HCTZ 25 mg and add amlodipine 5 mg which she was previously taking.

## 2021-11-26 NOTE — Assessment & Plan Note (Signed)
Discussed the possibility of starting medication to help with stress.  She is already working with a Scientist, forensic.  Right now she wants to hold off on medication and try to work on some of these other things first and then maybe circle back if needed.

## 2021-11-29 ENCOUNTER — Ambulatory Visit: Payer: BC Managed Care – PPO

## 2021-12-03 DIAGNOSIS — F411 Generalized anxiety disorder: Secondary | ICD-10-CM | POA: Diagnosis not present

## 2021-12-16 DIAGNOSIS — F411 Generalized anxiety disorder: Secondary | ICD-10-CM | POA: Diagnosis not present

## 2021-12-17 DIAGNOSIS — F411 Generalized anxiety disorder: Secondary | ICD-10-CM | POA: Diagnosis not present

## 2021-12-20 ENCOUNTER — Other Ambulatory Visit: Payer: Self-pay | Admitting: Family Medicine

## 2021-12-20 DIAGNOSIS — R519 Headache, unspecified: Secondary | ICD-10-CM

## 2022-01-10 ENCOUNTER — Ambulatory Visit: Payer: BC Managed Care – PPO | Admitting: Family Medicine

## 2022-01-22 ENCOUNTER — Other Ambulatory Visit: Payer: Self-pay | Admitting: Family Medicine

## 2022-01-22 DIAGNOSIS — R519 Headache, unspecified: Secondary | ICD-10-CM

## 2022-01-27 ENCOUNTER — Ambulatory Visit (INDEPENDENT_AMBULATORY_CARE_PROVIDER_SITE_OTHER): Payer: BC Managed Care – PPO | Admitting: Family Medicine

## 2022-01-27 ENCOUNTER — Encounter: Payer: Self-pay | Admitting: Family Medicine

## 2022-01-27 VITALS — BP 112/68 | HR 81 | Ht 66.0 in | Wt 178.0 lb

## 2022-01-27 DIAGNOSIS — Z23 Encounter for immunization: Secondary | ICD-10-CM | POA: Diagnosis not present

## 2022-01-27 DIAGNOSIS — G4709 Other insomnia: Secondary | ICD-10-CM | POA: Diagnosis not present

## 2022-01-27 DIAGNOSIS — Z566 Other physical and mental strain related to work: Secondary | ICD-10-CM | POA: Diagnosis not present

## 2022-01-27 DIAGNOSIS — T7840XA Allergy, unspecified, initial encounter: Secondary | ICD-10-CM

## 2022-01-27 DIAGNOSIS — I10 Essential (primary) hypertension: Secondary | ICD-10-CM | POA: Diagnosis not present

## 2022-01-27 DIAGNOSIS — R519 Headache, unspecified: Secondary | ICD-10-CM | POA: Diagnosis not present

## 2022-01-27 DIAGNOSIS — K5900 Constipation, unspecified: Secondary | ICD-10-CM

## 2022-01-27 NOTE — Progress Notes (Signed)
Established Patient Office Visit  Subjective   Patient ID: Pamela Stevens, female    DOB: Mar 04, 1971  Age: 51 y.o. MRN: [id number]  Chief Complaint  Patient presents with   Hypertension    HPI  Follow-up hypertension-she was previously on losartan but we decided to discontinue it because she was noting some hair loss.  We decided to switch back to HCTZ 25 and add amlodipine 5 mg which she had taken years before.  He had also been experiencing frequent headaches and so wanted to see if maybe the blood pressure was triggering some of this by bit and then blood pressure under better control and hoping that the headaches also improved.  She was also getting a lot of numbness and tingling down her shoulder and neck so I suspect she has some degenerative disc disease going on.  Unfortunately since starting the regimen she has had pretty significant constipation she says usually after week she will finally take something to help her bowels move.  She noticed a couple times when she was traveling and forgot to take her medication with her that her bowels are moving more easily.  Stress at work-she is working with a therapist currently and we had briefly discussed medication as an option as well if she felt like that needed to be added on for better symptom control.  So wanted to let me know about possible allergic reaction.  She said a couple years ago she drank a Moskal meal at a [employer] and a copper cup and actually broke out with redness hives and swelling on her face and ears.  Maybe a year or 2 after that she had a Moskal meal and a glass and had no reaction and then recently went back to the same restaurant locally and drink out of a cup or cup again and had hives and swelling.  She was able to take Benadryl to get relief.  Has been also having more bloating and gas and indigestion.  She has been taking occasional Rolaids.  She feels like her stomach is tight at times.  She denies  any significant change in diet.       ROS    Objective:     BP 112/68   Pulse 81   Ht 5\' 6"  (1.676 m)   Wt 178 lb (80.7 kg)   SpO2 98%   BMI 28.73 kg/m    Physical Exam Vitals and nursing note reviewed.  Constitutional:      Appearance: She is well-developed.  HENT:     Head: Normocephalic and atraumatic.  Cardiovascular:     Rate and Rhythm: Normal rate and regular rhythm.     Heart sounds: Normal heart sounds.  Pulmonary:     Effort: Pulmonary effort is normal.     Breath sounds: Normal breath sounds.  Skin:    General: Skin is warm and dry.  Neurological:     Mental Status: She is alert and oriented to person, place, and time.  Psychiatric:        Behavior: Behavior normal.      No results found for any visits on 01/27/22.    The ASCVD Risk score (Arnett DK, et al., 2019) failed to calculate for the following reasons:   Cannot find a previous HDL lab   Cannot find a previous total cholesterol lab    Assessment & Plan:   Problem List Items Addressed This Visit       Cardiovascular and Mediastinum  Hypertension goal BP (blood pressure) < 130/80 - Primary    BP is awesome today. Will adjust regimen since having constipation.  Please increase your amlodipine to 2 tabs daily, these can be taken at the same time.  And cut your HCTZ in half and take a half a tab daily.  We will see if the blood pressure still looks well controlled but we will see if the constipation improves.        Other   Stress at work    Doing with therapy/counseling.  She still really struggling with sleep.      Other insomnia    Really struggling with sleep.  Trazodone did not help.  Still using a weighted blanket which helps some.  She wants to give it another month or 2 to see if it improves on its own.      Frequent headaches   Constipation     He also consider taking a stool softener such as Colace daily to help keep the bowels moving a little bit more.  Make sure to  stay well-hydrated.  You can also consider adding MiraLAX to help do a cleanout.  Amend trial of Pepcid/famotidine taken twice a day to help reduce acid and indigestion for couple of weeks and then taper off as tolerated.      Other Visit Diagnoses     Need for Zostavax administration       Relevant Orders   Zoster Recombinant (Shingrix ) (Completed)   Allergic reaction, initial encounter       Relevant Orders   Ambulatory referral to Allergy       Return in about 3 months (around 04/29/2022) for HTN.    Nani Gasser, MD

## 2022-01-27 NOTE — Assessment & Plan Note (Signed)
  He also consider taking a stool softener such as Colace daily to help keep the bowels moving a little bit more.  Make sure to stay well-hydrated.  You can also consider adding MiraLAX to help do a cleanout.  Amend trial of Pepcid/famotidine taken twice a day to help reduce acid and indigestion for couple of weeks and then taper off as tolerated.

## 2022-01-27 NOTE — Patient Instructions (Signed)
Please increase your amlodipine to 2 tabs daily, these can be taken at the same time.  And cut your HCTZ in half and take a half a tab daily.  We will see if the blood pressure still looks well controlled but we will see if the constipation improves.  He also consider taking a stool softener such as Colace daily to help keep the bowels moving a little bit more.  Make sure to stay well-hydrated.  You can also consider adding MiraLAX to help do a cleanout.  Amend trial of Pepcid/famotidine taken twice a day to help reduce acid and indigestion for couple of weeks and then taper off as tolerated.

## 2022-01-27 NOTE — Assessment & Plan Note (Signed)
Really struggling with sleep.  Trazodone did not help.  Still using a weighted blanket which helps some.  She wants to give it another month or 2 to see if it improves on its own.

## 2022-01-27 NOTE — Assessment & Plan Note (Signed)
Doing with therapy/counseling.  She still really struggling with sleep.

## 2022-01-27 NOTE — Assessment & Plan Note (Signed)
BP is awesome today. Will adjust regimen since having constipation.  Please increase your amlodipine to 2 tabs daily, these can be taken at the same time.  And cut your HCTZ in half and take a half a tab daily.  We will see if the blood pressure still looks well controlled but we will see if the constipation improves.

## 2022-01-28 DIAGNOSIS — F411 Generalized anxiety disorder: Secondary | ICD-10-CM | POA: Diagnosis not present

## 2022-01-29 DIAGNOSIS — F411 Generalized anxiety disorder: Secondary | ICD-10-CM | POA: Diagnosis not present

## 2022-01-30 DIAGNOSIS — F411 Generalized anxiety disorder: Secondary | ICD-10-CM | POA: Diagnosis not present

## 2022-01-31 ENCOUNTER — Telehealth: Payer: Self-pay

## 2022-01-31 NOTE — Telephone Encounter (Signed)
Patient called and lvm - states  she just recently was give shingles vaccine - now having swollen lymph node under arm in arm pit-on side injection given-   states it is about 1/2 the size of a tennis ball/ protruding about 1/2"- she has been taking tylenol and using warm compresses. Dr. Linford Arnold was  in meeting at end of day when call came in - I spoke with Dr. Ashley Royalty who advised her that this is a common reaction to the vaccine  and should resolve in 3 to 4 days but could take up to a week . He also recommended she try ice instead of heat.  Patient was informed as above and told to  go to urgent care if things changed or worsened.  She states she is leaving for [place]  today but will got to urgent care there if needed.

## 2022-02-04 DIAGNOSIS — F411 Generalized anxiety disorder: Secondary | ICD-10-CM | POA: Diagnosis not present

## 2022-02-12 ENCOUNTER — Encounter: Payer: Self-pay | Admitting: Medical-Surgical

## 2022-02-12 ENCOUNTER — Ambulatory Visit (INDEPENDENT_AMBULATORY_CARE_PROVIDER_SITE_OTHER): Payer: BC Managed Care – PPO | Admitting: Medical-Surgical

## 2022-02-12 VITALS — BP 117/81 | HR 100 | Temp 99.5°F | Ht 66.0 in | Wt 177.0 lb

## 2022-02-12 DIAGNOSIS — R059 Cough, unspecified: Secondary | ICD-10-CM

## 2022-02-12 LAB — POC COVID19 BINAXNOW: SARS Coronavirus 2 Ag: NEGATIVE

## 2022-02-12 LAB — POCT INFLUENZA A/B
Influenza A, POC: NEGATIVE
Influenza B, POC: NEGATIVE

## 2022-02-12 MED ORDER — GUAIFENESIN-CODEINE 100-10 MG/5ML PO SYRP: 5.0000 mL | ORAL_SOLUTION | Freq: Three times a day (TID) | ORAL | 0 refills | Status: DC | PRN

## 2022-02-12 MED ORDER — AZITHROMYCIN 250 MG PO TABS: ORAL_TABLET | ORAL | 0 refills | Status: AC

## 2022-02-12 NOTE — Progress Notes (Signed)
Medical screening examination/treatment was performed by qualified nurse practitioner student and as supervising provider I was immediately available for consultation/collaboration. I have reviewed documentation and agree with assessment and plan. ° °Desaree Downen L. Roshana Shuffield, DNP, APRN, FNP-BC °[facility] °Primary Care and Sports Medicine ° °

## 2022-02-12 NOTE — Patient Instructions (Signed)
Medications & Home Remedies for Upper Respiratory Illness   Note: the following list assumes no pregnancy, normal liver & kidney function and no other drug interactions. Dr. Alexander has highlighted medications which are safe for you to use, but these may not be appropriate for everyone. Always ask a pharmacist or qualified medical provider if you have any questions!    Aches/Pains, Fever, Headache OTC Acetaminophen (Tylenol) 500 mg tablets - take max 2 tablets (1000 mg) every 6 hours (4 times per day)  OTC Ibuprofen (Motrin) 200 mg tablets - take max 4 tablets (800 mg) every 6 hours*   Sinus Congestion Prescription Atrovent as directed OTC Nasal Saline if desired to rinse OTC Oxymetolazone (Afrin, others) sparing use due to rebound congestion, NEVER use in kids OTC Phenylephrine (Sudafed) 10 mg tablets every 4 hours (or the 12-hour formulation)* OTC Diphenhydramine (Benadryl) 25 mg tablets - take max 2 tablets every 4 hours   Cough & Sore Throat Prescription cough pills or syrups as directed OTC Dextromethorphan (Robitussin, others) - cough suppressant OTC Guaifenesin (Robitussin, Mucinex, others) - expectorant (helps cough up mucus) (Dextromethorphan and Guaifenesin also come in a combination tablet/syrup) OTC Lozenges w/ Benzocaine + Menthol (Cepacol) Honey - as much as you want! Teas which "coat the throat" - look for ingredients Elm Bark, Licorice Root, Marshmallow Root   Other Prescription Oral Steroids to decrease inflammation and improve energy Prescription Antibiotics if these are necessary for bacterial infection - take ALL, even if you're feeling better  OTC Zinc Lozenges within 24 hours of symptoms onset - mixed evidence this shortens the duration of the common cold Don't waste your money on Vitamin C or Echinacea in acute illness - it's already too late!    *Caution in patients with high blood pressure   

## 2022-02-12 NOTE — Progress Notes (Signed)
Established Patient Office Visit  Subjective   Patient ID: Pamela Stevens, female    DOB: [birth year]  Age: 51 y.o. MRN: [id number]  Chief Complaint  Patient presents with   Cough    Cough Associated symptoms include myalgias and a sore throat.    Ms. Pamela Stevens is a 51 year old female who presents to the clinic today with C/O productive cough, chest congestions and rattles, itchy eyes, sore throat, runny nose, fever and body aches started approx 18 hours ago. She reports taking some nyquill that gave her some relief but it wears off.   Review of Systems  Constitutional:  Positive for malaise/fatigue.  HENT:  Positive for congestion and sore throat.   Respiratory:  Positive for cough.   Cardiovascular: Negative.   Gastrointestinal: Negative.   Genitourinary: Negative.   Musculoskeletal:  Positive for myalgias.  Skin: Negative.   Neurological: Negative.   Psychiatric/Behavioral: Negative.        Objective:     BP 117/81   Pulse 100   Temp 99.5 F (37.5 C) (Oral)   Ht 5\' 6"  (1.676 m)   Wt 80.3 kg   SpO2 98%   BMI 28.57 kg/m    Physical Exam Constitutional:      General: She is not in acute distress.    Appearance: Normal appearance. She is not ill-appearing, toxic-appearing or diaphoretic.  HENT:     Head: Normocephalic and atraumatic.     Right Ear: Tympanic membrane normal.     Left Ear: Tympanic membrane normal.     Nose: Rhinorrhea present.     Mouth/Throat:     Mouth: Mucous membranes are moist.  Eyes:     Pupils: Pupils are equal, round, and reactive to light.  Cardiovascular:     Rate and Rhythm: Normal rate and regular rhythm.     Pulses: Normal pulses.     Heart sounds: Normal heart sounds.  Pulmonary:     Effort: Pulmonary effort is normal.     Breath sounds: Normal breath sounds.  Skin:    General: Skin is warm and dry.  Neurological:     General: No focal deficit present.     Mental Status: She is alert and oriented to person, place,  and time. Mental status is at baseline.  Psychiatric:        Mood and Affect: Mood normal.        Behavior: Behavior normal.        Thought Content: Thought content normal.        Judgment: Judgment normal.      No results found for any visits on 02/12/22.    The ASCVD Risk score (Arnett DK, et al., 2019) failed to calculate for the following reasons:   Cannot find a previous HDL lab   Cannot find a previous total cholesterol lab    Assessment & Plan:   1. Cough, unspecified type Her Covid Flu A & B are negative. But given the abrupt onset of symptoms this appears to be viral. We've instructed her to get plenty of fluid and rest. We've also advised her to use heart friendly OTC multi-symptoms cold medication. We will also send a prescription strength anti-tussives. guaiFEBesin-codeine (ROBITUSSIN AC) 100-10 mg / 5 ml syrup, by mouth, three times daily as needed for cough. We've also sent a prescription for Zpack that should be started on Friday evening or Saturday at the earliest should her symptoms not improve. Offered albuterol inhaler, Tessalon perls, and  Incentive spirometry but patient declined. Reviewed care options available over the holiday/weekend if symptoms worsen.   - POC COVID-19 BinaxNow - POCT Influenza A/B  Return if symptoms worsen or fail to improve.    Loretha Stapler, RN

## 2022-02-20 DIAGNOSIS — F411 Generalized anxiety disorder: Secondary | ICD-10-CM | POA: Diagnosis not present

## 2022-02-20 DIAGNOSIS — B3731 Acute candidiasis of vulva and vagina: Secondary | ICD-10-CM | POA: Diagnosis not present

## 2022-02-20 DIAGNOSIS — Z6827 Body mass index (BMI) 27.0-27.9, adult: Secondary | ICD-10-CM | POA: Diagnosis not present

## 2022-02-20 DIAGNOSIS — Z01411 Encounter for gynecological examination (general) (routine) with abnormal findings: Secondary | ICD-10-CM | POA: Diagnosis not present

## 2022-02-24 ENCOUNTER — Other Ambulatory Visit: Payer: Self-pay | Admitting: *Deleted

## 2022-02-24 DIAGNOSIS — I10 Essential (primary) hypertension: Secondary | ICD-10-CM

## 2022-02-24 MED ORDER — AMLODIPINE BESYLATE 10 MG PO TABS: 10.0000 mg | ORAL_TABLET | Freq: Every day | ORAL | 1 refills | Status: DC

## 2022-03-07 ENCOUNTER — Ambulatory Visit: Payer: Self-pay | Admitting: Internal Medicine

## 2022-03-27 ENCOUNTER — Ambulatory Visit (INDEPENDENT_AMBULATORY_CARE_PROVIDER_SITE_OTHER): Payer: BC Managed Care – PPO | Admitting: Family Medicine

## 2022-03-27 ENCOUNTER — Encounter: Payer: Self-pay | Admitting: Family Medicine

## 2022-03-27 VITALS — BP 120/85 | HR 88 | Ht 66.0 in | Wt 171.0 lb

## 2022-03-27 DIAGNOSIS — R052 Subacute cough: Secondary | ICD-10-CM

## 2022-03-27 DIAGNOSIS — J019 Acute sinusitis, unspecified: Secondary | ICD-10-CM | POA: Diagnosis not present

## 2022-03-27 MED ORDER — AMOXICILLIN-POT CLAVULANATE 875-125 MG PO TABS: 1.0000 | ORAL_TABLET | Freq: Two times a day (BID) | ORAL | 0 refills | Status: DC

## 2022-03-27 NOTE — Progress Notes (Signed)
Pt reports that she has been experiencing this cough/congestion since right before thanksgiving.   She stated that the cough is all day and feels like she has something in the center of her chest. She can sometimes cough up mucus the last time she was able to cough up mucus was 2 days ago it was a yellow/green color. She has some sinus and nasal congestion. She takes Nyquil to help her to sleep and sometimes will take Dayquil if the coughing gets to bad during the day. Denies any f/s/c/ or bodyaches

## 2022-03-27 NOTE — Progress Notes (Signed)
   Acute Office Visit  Subjective:     Patient ID: Pamela Stevens, female    DOB: 1970/05/21, 52 y.o.   MRN: [id number]  Chief Complaint  Patient presents with   Cough    HPI Patient is in today for off.  She was seen initially on November 22 so about 5 weeks ago for cough that came on abruptly.  She was negative for COVID and flu at that time.    She stated that the cough is all day and feels like she has something in the center of her chest. She can sometimes cough up mucus the last time she was able to cough up mucus was 2 days ago it was a yellow/green color. She has some sinus and nasal congestion. She takes Nyquil to help her to sleep and sometimes will take Dayquil if the coughing gets to bad during the day. Denies any f/s/c/ or bodyaches    ROS      Objective:    BP 120/85   Pulse 88   Ht 5\' 6"  (1.676 m)   Wt 171 lb (77.6 kg)   SpO2 99%   BMI 27.60 kg/m    Physical Exam Constitutional:      Appearance: She is well-developed.  HENT:     Head: Normocephalic and atraumatic.     Right Ear: Tympanic membrane, ear canal and external ear normal.     Left Ear: Tympanic membrane, ear canal and external ear normal.     Nose: Nose normal.     Mouth/Throat:     Pharynx: Oropharynx is clear.  Eyes:     Conjunctiva/sclera: Conjunctivae normal.     Pupils: Pupils are equal, round, and reactive to light.  Neck:     Thyroid: No thyromegaly.  Cardiovascular:     Rate and Rhythm: Normal rate and regular rhythm.     Heart sounds: Normal heart sounds.  Pulmonary:     Effort: Pulmonary effort is normal.     Breath sounds: Normal breath sounds. No wheezing.  Musculoskeletal:     Cervical back: Neck supple.  Lymphadenopathy:     Cervical: No cervical adenopathy.  Skin:    General: Skin is warm and dry.  Neurological:     Mental Status: She is alert and oriented to person, place, and time.  Psychiatric:        Mood and Affect: Mood normal.    No results found  for any visits on 03/27/22.      Assessment & Plan:   Problem List Items Addressed This Visit   None Visit Diagnoses     Subacute cough    -  Primary   Acute non-recurrent sinusitis, unspecified location       Relevant Medications   amoxicillin-clavulanate (AUGMENTIN) 875-125 MG tablet      Subacute cough x 6 weeks with persistent sinus symptoms.  Will treat with Augmentin.  Call if not better in 1 week.  If cough persist then recommend chest x-ray for further workup.  Exam was clear today.  Meds ordered this encounter  Medications   amoxicillin-clavulanate (AUGMENTIN) 875-125 MG tablet    Sig: Take 1 tablet by mouth 2 (two) times daily.    Dispense:  14 tablet    Refill:  0    No follow-ups on file.  Beatrice Lecher, MD

## 2022-04-10 NOTE — Progress Notes (Addendum)
NEW PATIENT Date of Service/Encounter:  04/11/22 Referring provider: [name], * Primary care provider: [name], MD  Subjective:  Pamela Stevens is a 52 y.o. female with a PMHx of carpet tunnel, HTN, migraines, moderate tricuspid regurgitation, ascending aorta dilation presenting today for evaluation of "allergies" History obtained from: chart review and patient.   She has been having rashes with red wines which also causes migraines and itching, but drinks port wines without issues. She tolerates other alcohols except has had 2 reactions of red itchy rash after drinking [facility] in a copper cup, did not occur when drinking out of regular cup. Denies hives outside of these settings.  She is under a lot of stress.    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 She is no longer running as much.  She does sometimes feel chest tightness and air hunger when doing strenuous activity.  She is not able to use albuterol-causes jitteriness and tachycardia.  She has recently developed lactose intolerance. This occurred in adulthood.  She consumes dairy every day.  She denies GI illness prior to onset. Tolerates lactose free dairy.  PCP notes from referral 01/27/2022-possible allergic reaction including swelling and hives after drinking a [place] on 2 separate occasions but only occurs when drinking copper.   She does take loratadine on a regular basis due to sneezing, coughing and itchy eyes.  She will get congested.  Worse in Fall.  Also uses eye drops as needed. Occasionally uses saline spray.  Eczema-using OTC eczema creams.  Gets bumps on sides of her fingers which are filled with fluid.  She does have a flare on her hands currently, but she feels hands are somewhat controlled.  Other allergy screening: Medication allergy: no Hymenoptera allergy: no  Past Medical  History: Past Medical History:  Diagnosis Date   Allergy    DIABETES MELLITUS, GESTATIONAL, INSULIN-DEPENDENT 03/05/2007   Qualifier: Diagnosis of  By: [name]     Eczema    stress induced   Exercise-induced asthma    AS A CHILD   Gestational diabetes    Hypertension    PCOS (polycystic ovarian syndrome)    Medication List:  Current Outpatient Medications  Medication Sig Dispense Refill   amLODipine (NORVASC) 10 MG tablet Take 1 tablet (10 mg total) by mouth daily. 90 tablet 1   famotidine (PEPCID) 10 MG tablet Take 10 mg by mouth 2 (two) times daily.     hydrochlorothiazide (HYDRODIURIL) 25 MG tablet Take 1 tablet (25 mg total) by mouth daily. (Patient taking differently: Take 12.5 mg by mouth daily.) 90 tablet 1   levalbuterol (XOPENEX HFA) 45 MCG/ACT inhaler Inhale 2 puffs into the lungs every 4 (four) hours as needed for wheezing. 15 g 1   loratadine (CLARITIN) 10 MG tablet Take 10 mg by mouth daily.     magnesium 30 MG tablet Take 30 mg by mouth 2 (two) times daily.     topiramate (TOPAMAX) 25 MG tablet TAKE 1 TABLET BY MOUTH TWICE A DAY (Patient taking differently: Take 25 mg by mouth daily.) 180 tablet 1   No current facility-administered medications for this visit.   Known Allergies:  Allergies  Allergen Reactions   Irbesartan Other (See Comments)    Hair Loss   Clindamycin/Lincomycin Itching    Hair loss   Lisinopril Cough   Misc. Sulfonamide Containing Compounds Hives   Sulfa Antibiotics  Hives   Past Surgical History: Past Surgical History:  Procedure Laterality Date   NO PAST SURGERIES     WISDOM TOOTH EXTRACTION     Family History: Family History  Problem Relation Age of Onset   Diabetes Mellitus II Mother    Hypertension Mother    Diabetes Mellitus II Father    Hypercholesterolemia Father    Hypertension Father    Heart disease Father    Eczema Brother    Allergic rhinitis Brother    Hypertension Brother    Colon cancer Maternal Grandfather     Colon cancer Paternal Grandmother    Colon polyps Neg Hx    Asthma Neg Hx    Immunodeficiency Neg Hx    Atopy Neg Hx    Angioedema Neg Hx    Urticaria Neg Hx    Social History: [place] lives in a house fell 18 years ago carpet in the bedroom, no water damage, central AC, 2 dogs-both retriever's, 3 cats.  No cockroaches, not using dust mite protection on the bedding or pillows, no smoke exposure.  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  No HEPA filter in the home..   ROS:  All other systems negative except as noted per HPI.  Objective:  Blood pressure 118/76, pulse 96, temperature 98.3 F (36.8 C), temperature source Temporal, resp. rate 12, height 5\' 6"  (1.676 m), weight 171 lb (77.6 kg), SpO2 98 %. Body mass index is 27.6 kg/m. Physical Exam:  General Appearance:  Alert, cooperative, no distress, appears stated age  Head:  Normocephalic, without obvious abnormality, atraumatic  Eyes:  Conjunctiva clear, EOM's intact  Nose: Nares normal, hypertrophic turbinates, normal mucosa, and no visible anterior polyps  Throat: Lips, tongue normal; teeth and gums normal, normal posterior oropharynx  Neck: Supple, symmetrical  Lungs:   clear to auscultation bilaterally, Respirations unlabored, no coughing  Heart:  regular rate and rhythm and no murmur, Appears well perfused  Extremities: No edema  Skin: Skin color, texture, turgor normal, no rashes or lesions on visualized portions of skin  Neurologic: No gross deficits     Diagnostics: Spirometry:  Tracings reviewed. Her effort: Good reproducible efforts. Currently asymptomatic FVC: 2.94L  FEV1: 2.56L, 87% predicted  FEV1/FVC ratio: 109% Interpretation: Spirometry consistent with normal pattern   Skin Testing: Deferred due to recent antihistamines use.   Assessment and Plan  Hives -Hives can be from a number of different causes, and often an identifiable cause is not found. -In your case suspect  sulfites given reactions with red wine. - unclear what would have been cause of hives with [place] since you eat and tolerate ginger and limes.  Vodka which is typically used is usually free of sulfites - some individuals will get hives from any alcohol due to rise in body temperature, but this also does not seem the case - would avoid these drinks  - there is no specific testing for sulfite intolerances - copper can cause a contact dermatitis, but this is not the type of reaction which you experienced. Would not recommend copper patch testing. -if recurs, take zyrtec 10 mg (or similar-see list below), may take 2 tablets until hives resolve   Atopic Dermatitis/dyshidrotic eczema:  Daily Care For Maintenance (daily and continue even once eczema controlled) - Use hypoallergenic hydrating ointment at least twice daily.  This must be done daily for control of flares. (Great options include Vaseline, CeraVe, Aquaphor, Aveeno, Cetaphil, VaniCream, etc) - Avoid detergents, soaps or  lotions with fragrances/dyes - Limit showers/baths to 5 minutes and use luke warm water instead of hot, pat dry following baths, and apply moisturizer - can use steroid/non-steroid therapy creams as detailed below up to twice weekly for prevention of flares.  For Flares:(add this to maintenance therapy if needed for flares) First apply steroid/non-steroid treatment creams. Wait 5 minutes then apply moisturizer.  - Clobetasol 0.05% to body for severe flares-apply topically twice daily to red, raised, thickened areas of skin, followed by moisturizer. Do NOT use on face, groin or armpits.  Chronic Rhinitis suspect allergic: - allergy testing today: deferred due to recent antihistamine use  - Prevention:  - will discuss following allergy testing  - Symptom control: -Nasal saline rinses - Start Antihistamine: daily or daily as needed.   -Options include Zyrtec (Cetirizine) 10mg , Claritin (Loratadine) 10mg , Allegra  (Fexofenadine) 180mg , or Xyzal (Levocetirinze) 5mg  - Can be purchased over-the-counter if not covered by insurance.  Allergic Conjunctivitis:  - Continue Allergy Eye drops-great options include Pataday (Olopatadine) or Zaditor (ketotifen) for eye symptoms daily as needed-both sold over the counter if not covered by insurance.  -Avoid eye drops that say red eye relief as they may contain medications that dry out your eyes.  Exercise-induced Asthma: - your lung testing today looked great - Rescue Inhaler: Xopenex (levalbuterol) 2 puffs. Use  every 4-6 hours as needed for chest tightness, wheezing, or coughing.  Can also use 15 minutes prior to exercise if you have symptoms with activity. - Asthma is not controlled if:  - Symptoms are occurring >2 times a week OR  - >2 times a month nighttime awakenings  - You are requiring systemic steroids (prednisone/steroid injections) more than once per year  - Your require hospitalization for your asthma.  - Please call the clinic to schedule a follow up if these symptoms arise  Lactose Intolerance- - this is not an allergy but rather a problem with breaking down dairy (due to an enzyme deficiency) which can lead to bloating, gas, diarrhea, nausea and vomiting - choose lactose free dairy or take lactaid prior to eating dairy products that contain lactose  Sulfite Intolerance:  - hand out on sulfite avoidance provided  Return for allergy testing, must stop antihistamines for 3 days prior to that visit. (1-59, peanut, coconut, and onion)  It was a pleasure meeting you in clinic today! Thank you for allowing me to participate in your care.  This note in its entirety was forwarded to the Provider who requested this consultation.  Thank you for your kind referral. I appreciate the opportunity to take part in [facility] care. Please do not hesitate to contact me with questions.  Sincerely,  Sigurd Sos, MD [facility] [place]

## 2022-04-11 ENCOUNTER — Ambulatory Visit (INDEPENDENT_AMBULATORY_CARE_PROVIDER_SITE_OTHER): Payer: BC Managed Care – PPO | Admitting: Internal Medicine

## 2022-04-11 ENCOUNTER — Encounter: Payer: Self-pay | Admitting: Internal Medicine

## 2022-04-11 ENCOUNTER — Other Ambulatory Visit: Payer: Self-pay | Admitting: Internal Medicine

## 2022-04-11 VITALS — BP 118/76 | HR 96 | Temp 98.3°F | Resp 12 | Ht 66.0 in | Wt 171.0 lb

## 2022-04-11 DIAGNOSIS — L309 Dermatitis, unspecified: Secondary | ICD-10-CM | POA: Insufficient documentation

## 2022-04-11 DIAGNOSIS — R0602 Shortness of breath: Secondary | ICD-10-CM

## 2022-04-11 DIAGNOSIS — L308 Other specified dermatitis: Secondary | ICD-10-CM | POA: Diagnosis not present

## 2022-04-11 DIAGNOSIS — L508 Other urticaria: Secondary | ICD-10-CM

## 2022-04-11 DIAGNOSIS — J45990 Exercise induced bronchospasm: Secondary | ICD-10-CM | POA: Diagnosis not present

## 2022-04-11 DIAGNOSIS — E739 Lactose intolerance, unspecified: Secondary | ICD-10-CM | POA: Insufficient documentation

## 2022-04-11 DIAGNOSIS — Z9102 Food additives allergy status: Secondary | ICD-10-CM

## 2022-04-11 DIAGNOSIS — J310 Chronic rhinitis: Secondary | ICD-10-CM

## 2022-04-11 MED ORDER — LEVALBUTEROL TARTRATE 45 MCG/ACT IN AERO: 2.0000 | INHALATION_SPRAY | RESPIRATORY_TRACT | 1 refills | Status: DC | PRN

## 2022-04-11 MED ORDER — CLOBETASOL PROPIONATE 0.05 % EX OINT: TOPICAL_OINTMENT | CUTANEOUS | 1 refills | Status: AC

## 2022-04-11 NOTE — Telephone Encounter (Signed)
Please send to PA team

## 2022-04-11 NOTE — Patient Instructions (Addendum)
Hives -Hives can be from a number of different causes, and often an identifiable cause is not found. -In your case suspect sulfites given reactions with red wine. - unclear what would have been causes of hives with [place] since you eat and tolerate ginger and limes.  Vodka which is typically used is usually free of sulfites - some individuals will get hives from any alcohol due to rise in body temperature, but this also does not seem the case - would avoid these drinks for now - there is no specific testing for sulfite intolerances - copper can cause a contact dermatitis, but this is not the type of reaction which you experienced. Would not recommend copper patch testing.  -if recurs, take zyrtec 10 mg (or similar-see list below), may take 2 tablets until hives resolve   Atopic Dermatitis/dyshidrotic eczema:  Daily Care For Maintenance (daily and continue even once eczema controlled) - Use hypoallergenic hydrating ointment at least twice daily.  This must be done daily for control of flares. (Great options include Vaseline, CeraVe, Aquaphor, Aveeno, Cetaphil, VaniCream, etc) - Avoid detergents, soaps or lotions with fragrances/dyes - Limit showers/baths to 5 minutes and use luke warm water instead of hot, pat dry following baths, and apply moisturizer - can use steroid/non-steroid therapy creams as detailed below up to twice weekly for prevention of flares.  For Flares:(add this to maintenance therapy if needed for flares) First apply steroid/non-steroid treatment creams. Wait 5 minutes then apply moisturizer.  - Clobetasol 0.05% to body for severe flares-apply topically twice daily to red, raised, thickened areas of skin, followed by moisturizer. Do NOT use on face, groin or armpits.  Chronic Rhinitis  suspect allergic : - allergy testing today: deferred due to recent antihistamine use  - Prevention:  - will discuss following allergy testing  - Symptom control: -Nasal saline  rinses - Start Antihistamine: daily or daily as needed.   -Options include Zyrtec (Cetirizine) 10mg , Claritin (Loratadine) 10mg , Allegra (Fexofenadine) 180mg , or Xyzal (Levocetirinze) 5mg  - Can be purchased over-the-counter if not covered by insurance.  Allergic Conjunctivitis:  - Continue Allergy Eye drops-great options include Pataday (Olopatadine) or Zaditor (ketotifen) for eye symptoms daily as needed-both sold over the counter if not covered by insurance.  -Avoid eye drops that say red eye relief as they may contain medications that dry out your eyes.  Exercise-induced Asthma: - your lung testing today looked great - Rescue Inhaler: Xopenex (levalbuterol) 2 puffs. Use  every 4-6 hours as needed for chest tightness, wheezing, or coughing.  Can also use 15 minutes prior to exercise if you have symptoms with activity. - Asthma is not controlled if:  - Symptoms are occurring >2 times a week OR  - >2 times a month nighttime awakenings  - You are requiring systemic steroids (prednisone/steroid injections) more than once per year  - Your require hospitalization for your asthma.  - Please call the clinic to schedule a follow up if these symptoms arise  Lactose Intolerance- - this is not an allergy but rather a problem with breaking down dairy (due to an enzyme deficiency) which can lead to bloating, gas, diarrhea, nausea and vomiting - choose lactose free dairy or take lactaid prior to eating dairy products that contain lactose  Sulfite Intolerance:  - hand out on sulfite avoidance provided  Return for allergy testing, must stop antihistamines for 3 days prior to that visit. (1-59, peanut, coconut, and onion)  It was a pleasure meeting you in clinic today! Thank  you for allowing me to participate in your care.  Sigurd Sos, MD [facility]

## 2022-04-14 ENCOUNTER — Other Ambulatory Visit (HOSPITAL_COMMUNITY): Payer: Self-pay

## 2022-04-14 ENCOUNTER — Telehealth: Payer: Self-pay

## 2022-04-14 NOTE — Telephone Encounter (Signed)
Pa for xopenex please

## 2022-04-14 NOTE — Telephone Encounter (Signed)
Patient Advocate Encounter   Received notification from [place] [employer] that prior authorization is required for Xopenex HFA 45MCG/ACT aerosol   Submitted: 04-14-2022 Key [id number]  Faxed to number supplied by insurance [id number]  Status is pending

## 2022-04-14 NOTE — Addendum Note (Signed)
Addended by: Felipa Emory on: 04/14/2022 08:04 AM   Modules accepted: Orders

## 2022-04-14 NOTE — Telephone Encounter (Signed)
Prior authorization submitted and pending in another encounter

## 2022-04-17 DIAGNOSIS — F411 Generalized anxiety disorder: Secondary | ICD-10-CM | POA: Diagnosis not present

## 2022-04-21 DIAGNOSIS — N182 Chronic kidney disease, stage 2 (mild): Secondary | ICD-10-CM | POA: Diagnosis not present

## 2022-04-22 ENCOUNTER — Ambulatory Visit: Payer: BC Managed Care – PPO | Admitting: Internal Medicine

## 2022-04-22 ENCOUNTER — Telehealth: Payer: Self-pay | Admitting: *Deleted

## 2022-04-22 DIAGNOSIS — F411 Generalized anxiety disorder: Secondary | ICD-10-CM | POA: Diagnosis not present

## 2022-04-22 DIAGNOSIS — Z566 Other physical and mental strain related to work: Secondary | ICD-10-CM

## 2022-04-22 MED ORDER — HYDROCHLOROTHIAZIDE 12.5 MG PO TABS: 12.5000 mg | ORAL_TABLET | Freq: Every day | ORAL | 1 refills | Status: DC

## 2022-04-22 MED ORDER — TRAZODONE HCL 50 MG PO TABS: 25.0000 mg | ORAL_TABLET | Freq: Every evening | ORAL | 3 refills | Status: DC | PRN

## 2022-04-22 NOTE — Telephone Encounter (Signed)
Pt called and LVM stating that she has been feeling very anxious and stressed lately and wanted to know if Dr. Madilyn Fireman would send something in for her. She stated that she had spoken to Dr. Madilyn Fireman about this during her last OV pt stated that she was also dealing with insomnia and this was also something that she and Dr. Madilyn Fireman were looking into. She doesn't feel that she can wait until her appt on the 8th due to recent changes with her job to discuss this and would like to start something now.   Pt's pharmacy confirmed [facility].

## 2022-04-22 NOTE — Telephone Encounter (Signed)
Pamela Stevens states she tried trazodone in August and it did not help. She would like a different medication.

## 2022-04-22 NOTE — Telephone Encounter (Signed)
Meds ordered this encounter  Medications   hydrochlorothiazide (HYDRODIURIL) 12.5 MG tablet    Sig: Take 1 tablet (12.5 mg total) by mouth daily.    Dispense:  90 tablet    Refill:  1   traZODone (DESYREL) 50 MG tablet    Sig: Take 0.5-1 tablets (25-50 mg total) by mouth at bedtime as needed for sleep.    Dispense:  30 tablet    Refill:  3

## 2022-04-23 MED ORDER — SERTRALINE HCL 25 MG PO TABS: ORAL_TABLET | ORAL | 0 refills | Status: DC

## 2022-04-23 NOTE — Telephone Encounter (Signed)
Please sign off on this encounter, thank you! 

## 2022-04-23 NOTE — Telephone Encounter (Signed)
Pt advised of medications being sent to her pharmacy.

## 2022-04-23 NOTE — Telephone Encounter (Signed)
Meds ordered this encounter  Medications   hydrochlorothiazide (HYDRODIURIL) 12.5 MG tablet    Sig: Take 1 tablet (12.5 mg total) by mouth daily.    Dispense:  90 tablet    Refill:  1   DISCONTD: traZODone (DESYREL) 50 MG tablet    Sig: Take 0.5-1 tablets (25-50 mg total) by mouth at bedtime as needed for sleep.    Dispense:  30 tablet    Refill:  3   sertraline (ZOLOFT) 25 MG tablet    Sig: Take 1 tablet (25 mg total) by mouth at bedtime for 14 days, THEN 2 tablets (50 mg total) at bedtime for 16 days.    Dispense:  60 tablet    Refill:  0   Sent in rx for sertraline to start. Takes a couple of weeks to start helping.

## 2022-04-25 ENCOUNTER — Ambulatory Visit: Payer: BC Managed Care – PPO | Admitting: Internal Medicine

## 2022-04-29 DIAGNOSIS — F411 Generalized anxiety disorder: Secondary | ICD-10-CM | POA: Diagnosis not present

## 2022-04-30 DIAGNOSIS — F411 Generalized anxiety disorder: Secondary | ICD-10-CM | POA: Diagnosis not present

## 2022-04-30 NOTE — Progress Notes (Unsigned)
   Established Patient Office Visit  Subjective   Patient ID: Pamela Stevens, female    DOB: [birth year]  Age: 52 y.o. MRN: [id number]  No chief complaint on file.   HPI  Hypertension- Pt denies chest pain, SOB, dizziness, or heart palpitations.  Taking meds as directed w/o problems.  Denies medication side effects.     {History (Optional):23778}  ROS    Objective:     There were no vitals taken for this visit. {Vitals History (Optional):23777}  Physical Exam   No results found for any visits on 05/01/22.  {Labs (Optional):23779}  The ASCVD Risk score (Arnett DK, et al., 2019) failed to calculate for the following reasons:   Cannot find a previous HDL lab   Cannot find a previous total cholesterol lab    Assessment & Plan:   Problem List Items Addressed This Visit       Cardiovascular and Mediastinum   Hypertension goal BP (blood pressure) < 130/80 - Primary    No follow-ups on file.    Beatrice Lecher, MD

## 2022-05-01 ENCOUNTER — Encounter: Payer: Self-pay | Admitting: Family Medicine

## 2022-05-01 ENCOUNTER — Ambulatory Visit (INDEPENDENT_AMBULATORY_CARE_PROVIDER_SITE_OTHER): Payer: BC Managed Care – PPO | Admitting: Family Medicine

## 2022-05-01 VITALS — BP 122/76 | HR 80 | Ht 66.0 in | Wt 169.0 lb

## 2022-05-01 DIAGNOSIS — Z566 Other physical and mental strain related to work: Secondary | ICD-10-CM | POA: Diagnosis not present

## 2022-05-01 DIAGNOSIS — F411 Generalized anxiety disorder: Secondary | ICD-10-CM | POA: Diagnosis not present

## 2022-05-01 DIAGNOSIS — R0789 Other chest pain: Secondary | ICD-10-CM

## 2022-05-01 DIAGNOSIS — R7301 Impaired fasting glucose: Secondary | ICD-10-CM | POA: Diagnosis not present

## 2022-05-01 DIAGNOSIS — I10 Essential (primary) hypertension: Secondary | ICD-10-CM | POA: Diagnosis not present

## 2022-05-01 LAB — POCT GLYCOSYLATED HEMOGLOBIN (HGB A1C): Hemoglobin A1C: 5.9 % — AB (ref 4.0–5.6)

## 2022-05-01 NOTE — Progress Notes (Signed)
Pt states that she is doing well on the current dosage of Sertraline 25 mg she is currently on day 8 of the 14 day taper.   She do c/o having some sleep issues. She has been using either z-quill or nyquil which work well for her. She said that Trazodone doesn't work for her so she didn't pick this up.

## 2022-05-01 NOTE — Assessment & Plan Note (Signed)
Repeat A1c is up slightly today compared to previous 6 months ago at 5.7.  Encouraged her to continue to work on healthy diet and regular exercise.  Lab Results  Component Value Date   HGBA1C 5.9 (A) 05/01/2022

## 2022-05-01 NOTE — Assessment & Plan Note (Signed)
She has decided to start looking for a new job even if she makes it through the layoffs to really work on her personal health and reducing her stress levels which I think is a great idea.  For now continue with Zoloft and taper up to 50 mg as per instructions on the bottle.  Follow-up in about 5 weeks to see how she is doing.  Encouraged her to really work on trying to improve her sleep quality.  If his equals helpful then we will continue with that but if it is not consider a trial of Atarax.

## 2022-05-01 NOTE — Assessment & Plan Note (Signed)
Pressure looks phenomenal today at even with being really stressed.  Will continue current regimen which seems to be working well.  Continue to work on incorporating exercise back into her routine even if it is just doing stretching or yoga or walking.

## 2022-05-14 DIAGNOSIS — F411 Generalized anxiety disorder: Secondary | ICD-10-CM | POA: Diagnosis not present

## 2022-05-17 ENCOUNTER — Other Ambulatory Visit: Payer: Self-pay | Admitting: Family Medicine

## 2022-05-17 DIAGNOSIS — Z566 Other physical and mental strain related to work: Secondary | ICD-10-CM

## 2022-05-20 MED ORDER — SERTRALINE HCL 50 MG PO TABS: 50.0000 mg | ORAL_TABLET | Freq: Every day | ORAL | 1 refills | Status: DC

## 2022-05-21 ENCOUNTER — Other Ambulatory Visit: Payer: Self-pay | Admitting: Family Medicine

## 2022-05-21 DIAGNOSIS — I10 Essential (primary) hypertension: Secondary | ICD-10-CM

## 2022-05-27 DIAGNOSIS — F411 Generalized anxiety disorder: Secondary | ICD-10-CM | POA: Diagnosis not present

## 2022-05-28 DIAGNOSIS — F411 Generalized anxiety disorder: Secondary | ICD-10-CM | POA: Diagnosis not present

## 2022-06-05 ENCOUNTER — Ambulatory Visit (INDEPENDENT_AMBULATORY_CARE_PROVIDER_SITE_OTHER): Payer: BC Managed Care – PPO | Admitting: Family Medicine

## 2022-06-05 ENCOUNTER — Other Ambulatory Visit (HOSPITAL_COMMUNITY): Payer: Self-pay

## 2022-06-05 ENCOUNTER — Encounter: Payer: Self-pay | Admitting: Family Medicine

## 2022-06-05 ENCOUNTER — Other Ambulatory Visit: Payer: Self-pay | Admitting: Family Medicine

## 2022-06-05 VITALS — BP 116/72 | HR 80 | Ht 66.0 in | Wt 175.0 lb

## 2022-06-05 DIAGNOSIS — I10 Essential (primary) hypertension: Secondary | ICD-10-CM

## 2022-06-05 DIAGNOSIS — G4709 Other insomnia: Secondary | ICD-10-CM | POA: Diagnosis not present

## 2022-06-05 DIAGNOSIS — Z566 Other physical and mental strain related to work: Secondary | ICD-10-CM | POA: Diagnosis not present

## 2022-06-05 DIAGNOSIS — F411 Generalized anxiety disorder: Secondary | ICD-10-CM | POA: Diagnosis not present

## 2022-06-05 DIAGNOSIS — J3489 Other specified disorders of nose and nasal sinuses: Secondary | ICD-10-CM

## 2022-06-05 MED ORDER — MUPIROCIN 2 % EX OINT: TOPICAL_OINTMENT | CUTANEOUS | 0 refills | Status: DC

## 2022-06-05 NOTE — Patient Instructions (Signed)
Try increasing the sertraline to 1.5 tabs daily.

## 2022-06-05 NOTE — Progress Notes (Signed)
Established Patient Office Visit  Subjective   Patient ID: Pamela Stevens, female    DOB: 06-Sep-1970  Age: 52 y.o. MRN: [id number]  Chief Complaint  Patient presents with   stress at work    HPI  Follow-up recent increased stress-we decided to start sertraline and gradually increase to 50 mg.  Here for 5-week follow-up.  Her work was going through [employer] when I last saw her. She decided to volunteer to be laid off, and feels good about her decision. They are finally goingto move into their new home and sell their current home.  She did try going up on the sertraline to 50 mg but started having diarrhea pretty quickly.  She continued the sertraline 50 mg for about a week and then finally decided to stop it.  The diarrhea lasted most 3 weeks total.  She is back down to 25 mg and has been taking that consistently.  She does feel like things are in a little bit better place.  She was also still having some increased anxiety after going up on the 50 mg.  She is also had an issue with some clogging and congestion in her right nostril.  She says it will scab up with tissue and then at night she feels congested and then blows it out and it comes out in a chunk.     ROS    Objective:     BP 116/72   Pulse 80   Ht '5\' 6"'$  (1.676 m)   Wt 175 lb (79.4 kg)   SpO2 99%   BMI 28.25 kg/m    Physical Exam Vitals and nursing note reviewed.  Constitutional:      Appearance: She is well-developed.  HENT:     Head: Normocephalic and atraumatic.  Cardiovascular:     Rate and Rhythm: Normal rate and regular rhythm.     Heart sounds: Normal heart sounds.  Pulmonary:     Effort: Pulmonary effort is normal.     Breath sounds: Normal breath sounds.  Skin:    General: Skin is warm and dry.  Neurological:     Mental Status: She is alert and oriented to person, place, and time.  Psychiatric:        Behavior: Behavior normal.     No results found for any visits on  06/05/22.    The ASCVD Risk score (Arnett DK, et al., 2019) failed to calculate for the following reasons:   Cannot find a previous HDL lab   Cannot find a previous total cholesterol lab    Assessment & Plan:   Problem List Items Addressed This Visit       Cardiovascular and Mediastinum   Hypertension goal BP (blood pressure) < 130/80    Pressure continues to look amazing today.        Other   Stress at work - Primary    Discussed a couple of options.  1 option is to stay at 25 mg or try to go up just slightly by taking 1-1/2 tabs.  Or we discussed maybe switching to a completely different SSRI which is perfectly reasonable.  Her PHQ-9 score did improve today.  And I do think things have settled down.  She is already on the look for a new job.  And there may be a slight transition.  After starting the new job.  So our plan will be to follow-up in about 3 to 4 months to make sure that she is  doing well and make any adjustments needed      Other insomnia    Not sleeping great but stress levels are little better.      Other Visit Diagnoses     Nasal sore          Nasal  sore-it sounds like it is just not healing and scabbing over and then peeling off.  Recommended trial of mupirocin ointment as well as running a humidifier if she has 1.  Avoid excessively blowing the nose.  Hopefully will improve over 1 to 2 weeks.  Return in about 4 months (around 09/22/2022) for Mood/Stress.    Beatrice Lecher, MD

## 2022-06-05 NOTE — Assessment & Plan Note (Signed)
Discussed a couple of options.  1 option is to stay at 25 mg or try to go up just slightly by taking 1-1/2 tabs.  Or we discussed maybe switching to a completely different SSRI which is perfectly reasonable.  Her PHQ-9 score did improve today.  And I do think things have settled down.  She is already on the look for a new job.  And there may be a slight transition.  After starting the new job.  So our plan will be to follow-up in about 3 to 4 months to make sure that she is doing well and make any adjustments needed

## 2022-06-05 NOTE — Assessment & Plan Note (Signed)
Pressure continues to look amazing today.

## 2022-06-05 NOTE — Assessment & Plan Note (Signed)
Not sleeping great but stress levels are little better.

## 2022-06-05 NOTE — Telephone Encounter (Signed)
Patient Advocate Encounter  Prior authorization came back saying that patient was not found.   Was resubmitted with new plan ([place] [employer] Part D) but found that medication has been discontinued. Will update with determination in case therapy change wanted.  Key: [id number]

## 2022-06-10 DIAGNOSIS — F411 Generalized anxiety disorder: Secondary | ICD-10-CM | POA: Diagnosis not present

## 2022-06-11 DIAGNOSIS — F411 Generalized anxiety disorder: Secondary | ICD-10-CM | POA: Diagnosis not present

## 2022-06-17 DIAGNOSIS — F411 Generalized anxiety disorder: Secondary | ICD-10-CM | POA: Diagnosis not present

## 2022-09-22 ENCOUNTER — Ambulatory Visit (INDEPENDENT_AMBULATORY_CARE_PROVIDER_SITE_OTHER): Payer: BC Managed Care – PPO

## 2022-09-22 ENCOUNTER — Ambulatory Visit (INDEPENDENT_AMBULATORY_CARE_PROVIDER_SITE_OTHER): Payer: BC Managed Care – PPO | Admitting: Family Medicine

## 2022-09-22 ENCOUNTER — Encounter: Payer: Self-pay | Admitting: Family Medicine

## 2022-09-22 VITALS — BP 110/71 | HR 74 | Ht 66.0 in | Wt 174.0 lb

## 2022-09-22 DIAGNOSIS — E739 Lactose intolerance, unspecified: Secondary | ICD-10-CM

## 2022-09-22 DIAGNOSIS — R140 Abdominal distension (gaseous): Secondary | ICD-10-CM

## 2022-09-22 DIAGNOSIS — R142 Eructation: Secondary | ICD-10-CM

## 2022-09-22 DIAGNOSIS — G4709 Other insomnia: Secondary | ICD-10-CM

## 2022-09-22 DIAGNOSIS — R109 Unspecified abdominal pain: Secondary | ICD-10-CM | POA: Diagnosis not present

## 2022-09-22 DIAGNOSIS — Z566 Other physical and mental strain related to work: Secondary | ICD-10-CM | POA: Diagnosis not present

## 2022-09-22 DIAGNOSIS — H6991 Unspecified Eustachian tube disorder, right ear: Secondary | ICD-10-CM

## 2022-09-22 DIAGNOSIS — R42 Dizziness and giddiness: Secondary | ICD-10-CM

## 2022-09-22 LAB — CBC WITH DIFFERENTIAL/PLATELET
Absolute Monocytes: 703 cells/uL (ref 200–950)
Eosinophils Absolute: 178 cells/uL (ref 15–500)
Neutrophils Relative %: 62.1 %
RDW: 12.9 % (ref 11.0–15.0)
WBC: 8.9 10*3/uL (ref 3.8–10.8)

## 2022-09-22 MED ORDER — DAYVIGO 5 MG PO TABS: 5.0000 mg | ORAL_TABLET | Freq: Every day | ORAL | 1 refills | Status: DC

## 2022-09-22 NOTE — Progress Notes (Addendum)
Established Patient Office Visit  Subjective   Patient ID: Pamela Stevens, female    DOB: 12/12/1970  Age: 52 y.o. MRN: [id number]  Chief Complaint  Patient presents with   mood    Pt reports that she doesn't feel that the Zoloft is working well for her and would like to discuss other options.  Also reports that she is still experiencing some trouble with sleeping.   Bloated    She stated that she has felt bloated and had some indigestion. She stated that her stomach will get really hard. She has taken gas-x and pepto for this and feels that this has contributed to some SOB. She did have some blood and mucus in her stool 1 wk ago. Asked if she had anything w/red dye she stated that she did not but did eat a nut mix.   She did confirm that she has some pain at her sternum that comes and goes    HPI F/U stess - Pt reports that she doesn't feel that the Zoloft is working well for her and would like to discuss other options. Also reports that she is still experiencing some trouble with sleeping.    She stated that she has felt bloated and had some indigestion. She stated that her stomach will get really hard. She has taken gas-x and pepto for this and feels that this has contributed to some SOB. She did have some blood and mucus in her stool 1 wk ago. Asked if she had anything w/red dye she stated that she did not but did eat a nut mix.  She did confirm that she has some pain at her sternum that comes and goes    In her right ear she has had some fullness, difficulty hearing, crinkling noise and some vertigo.  Though she had 2 days where she had intense and of vertigo she felt she was going to vomit and had to lay on the couch.  That intensity has resolved but the ear has not improved much it has been going on for probably a little over a month.      Flowsheet Row Office Visit from 09/22/2022 in [facility] at [facility] [place]  PHQ-9 Total Score 6          09/22/2022    9:02 AM 06/05/2022    9:25 AM 05/01/2022    9:22 AM 01/27/2022    8:51 AM  GAD 7 : Generalized Anxiety Score  Nervous, Anxious, on Edge 0 0 3 1  Control/stop worrying 0 0 3 0  Worry too much - different things 0 0 3 2  Trouble relaxing 1 0 3 2  Restless 0 0 0 0  Easily annoyed or irritable 0 0 0 1  Afraid - awful might happen 0 0 3 0  Total GAD 7 Score 1 0 15 6  Anxiety Difficulty Somewhat difficult Not difficult at all Somewhat difficult Somewhat difficult        ROS    Objective:     BP 110/71   Pulse 74   Ht 5\' 6"  (1.676 m)   Wt 174 lb (78.9 kg)   SpO2 100%   BMI 28.08 kg/m    Physical Exam Vitals and nursing note reviewed.  Constitutional:      Appearance: She is well-developed.  HENT:     Head: Normocephalic and atraumatic.  Cardiovascular:     Rate and Rhythm: Normal rate and regular rhythm.  Heart sounds: Normal heart sounds.  Pulmonary:     Effort: Pulmonary effort is normal.     Breath sounds: Normal breath sounds.  Skin:    General: Skin is warm and dry.  Neurological:     Mental Status: She is alert and oriented to person, place, and time.  Psychiatric:        Behavior: Behavior normal.     No results found for any visits on 09/22/22.    The ASCVD Risk score (Arnett DK, et al., 2019) failed to calculate for the following reasons:   Cannot find a previous HDL lab   Cannot find a previous total cholesterol lab    Assessment & Plan:   Problem List Items Addressed This Visit       Other   Stress at work - Primary    Well now.  She feels like she is better able to set some boundaries.  Cut sertraline in half and take half a tab daily until you run out.      Other insomnia    Trazodone didn't work well.  Will try Dayvigo.  Card for 10 days trial.        Relevant Medications   Lemborexant (DAYVIGO) 5 MG TABS   Lactose intolerance   Other Visit Diagnoses     Bloated abdomen       Relevant Orders   DG  Abd 1 View   COMPLETE METABOLIC PANEL WITH GFR   CBC with Differential/Platelet   Burping       Relevant Orders   COMPLETE METABOLIC PANEL WITH GFR   CBC with Differential/Platelet   Acute dysfunction of right eustachian tube       Vertigo          Bloating-will get a KUB.  Colonoscopy is up-to-date.  She does not have any discrete or localized pain which is reassuring.  Nontender on exam.  Recommend a trial of a pre-/probiotic.  She is already on a PPI.  If not improving please let us know.  Ear fullness--  trial of Flonase or Nasonex, 2 squirts in each nostril daily for 10 days and see if symptoms with your ear improved.  Not improving consider alternative diagnoses such as viral vestibulitis versus Mnire's as she has been having some ringing in that ear as well.  Pamela Stevens is also having some decreased hearing and vertigo symptoms.  We discussed a trial of a nasal steroid spray for possible eustachian tube dysfunction but also consider Mnire's or vestibulitis.  No follow-ups on file.    Nani Gasser, MD

## 2022-09-22 NOTE — Assessment & Plan Note (Signed)
Trazodone didn't work well.  Will try Dayvigo.  Card for 10 days trial.

## 2022-09-22 NOTE — Patient Instructions (Addendum)
Cut sertraline in half and take half a tab daily until you run out. Mended trial of Flonase or Nasonex, 2 squirts in each nostril daily for 10 days and see if symptoms with your ear improved.

## 2022-09-22 NOTE — Assessment & Plan Note (Addendum)
Well now.  She feels like she is better able to set some boundaries.  Cut sertraline in half and take half a tab daily until you run out.

## 2022-09-23 LAB — COMPLETE METABOLIC PANEL WITH GFR
AG Ratio: 1.6 (calc) (ref 1.0–2.5)
ALT: 16 U/L (ref 6–29)
AST: 15 U/L (ref 10–35)
Albumin: 4.7 g/dL (ref 3.6–5.1)
Alkaline phosphatase (APISO): 74 U/L (ref 37–153)
BUN/Creatinine Ratio: 18 (calc) (ref 6–22)
BUN: 19 mg/dL (ref 7–25)
CO2: 31 mmol/L (ref 20–32)
Calcium: 10 mg/dL (ref 8.6–10.4)
Chloride: 101 mmol/L (ref 98–110)
Creat: 1.07 mg/dL — ABNORMAL HIGH (ref 0.50–1.03)
Globulin: 3 g/dL (calc) (ref 1.9–3.7)
Glucose, Bld: 92 mg/dL (ref 65–99)
Potassium: 3.7 mmol/L (ref 3.5–5.3)
Sodium: 139 mmol/L (ref 135–146)
Total Bilirubin: 0.4 mg/dL (ref 0.2–1.2)
Total Protein: 7.7 g/dL (ref 6.1–8.1)
eGFR: 63 mL/min/{1.73_m2} (ref >=60)

## 2022-09-23 LAB — CBC WITH DIFFERENTIAL/PLATELET
Basophils Absolute: 53 {cells}/uL (ref 0–200)
Basophils Relative: 0.6 %
Eosinophils Relative: 2 %
HCT: 41.9 % (ref 35.0–45.0)
Hemoglobin: 13.9 g/dL (ref 11.7–15.5)
Lymphs Abs: 2439 {cells}/uL (ref 850–3900)
MCH: 29.5 pg (ref 27.0–33.0)
MCHC: 33.2 g/dL (ref 32.0–36.0)
MCV: 89 fL (ref 80.0–100.0)
MPV: 10 fL (ref 7.5–12.5)
Monocytes Relative: 7.9 %
Neutro Abs: 5527 cells/uL (ref 1500–7800)
Platelets: 305 10*3/uL (ref 140–400)
RBC: 4.71 10*6/uL (ref 3.80–5.10)
Total Lymphocyte: 27.4 %

## 2022-09-23 NOTE — Progress Notes (Signed)
Your lab work is within acceptable range and there are no concerning findings.   ?

## 2022-09-29 NOTE — Progress Notes (Signed)
HI Eldana, no major abnormalities on the plain film they did see a small to moderate amount of stool in the colon.  Hopefully the probiotic is starting to help.  But if not please let us know.

## 2022-09-30 ENCOUNTER — Encounter: Payer: Self-pay | Admitting: Family Medicine

## 2022-09-30 DIAGNOSIS — R42 Dizziness and giddiness: Secondary | ICD-10-CM

## 2022-09-30 DIAGNOSIS — H6991 Unspecified Eustachian tube disorder, right ear: Secondary | ICD-10-CM

## 2022-09-30 DIAGNOSIS — R052 Subacute cough: Secondary | ICD-10-CM

## 2022-09-30 MED ORDER — AMOXICILLIN-POT CLAVULANATE 875-125 MG PO TABS
1.0000 | ORAL_TABLET | Freq: Two times a day (BID) | ORAL | 0 refills | Status: DC
Start: 2022-09-30 — End: 2022-10-10

## 2022-10-08 ENCOUNTER — Telehealth: Payer: Self-pay | Admitting: Family Medicine

## 2022-10-08 NOTE — Addendum Note (Signed)
Addended by: Nani Gasser D on: 10/08/2022 03:22 PM   Modules accepted: Orders

## 2022-10-08 NOTE — Telephone Encounter (Signed)
Let get CXR. Ordered.

## 2022-10-08 NOTE — Telephone Encounter (Signed)
HI Folashade, your Dayvigo ws not covered. Would you be open to trying Belsomra?

## 2022-10-09 ENCOUNTER — Ambulatory Visit (INDEPENDENT_AMBULATORY_CARE_PROVIDER_SITE_OTHER): Payer: BC Managed Care – PPO

## 2022-10-09 DIAGNOSIS — R052 Subacute cough: Secondary | ICD-10-CM | POA: Diagnosis not present

## 2022-10-09 DIAGNOSIS — R059 Cough, unspecified: Secondary | ICD-10-CM | POA: Diagnosis not present

## 2022-10-09 DIAGNOSIS — I771 Stricture of artery: Secondary | ICD-10-CM | POA: Diagnosis not present

## 2022-10-09 DIAGNOSIS — R42 Dizziness and giddiness: Secondary | ICD-10-CM | POA: Diagnosis not present

## 2022-10-09 NOTE — Telephone Encounter (Signed)
She states she is not sure she has sleep issues. She states the cough is what keeps her up and wakes her up. She will wait for now to see if the cough resolves.

## 2022-10-10 ENCOUNTER — Telehealth: Payer: Self-pay | Admitting: Family Medicine

## 2022-10-10 MED ORDER — DOXYCYCLINE HYCLATE 100 MG PO TABS
100.0000 mg | ORAL_TABLET | Freq: Two times a day (BID) | ORAL | 0 refills | Status: DC
Start: 2022-10-10 — End: 2022-11-12

## 2022-10-10 NOTE — Telephone Encounter (Signed)
2nd abx sent in

## 2022-10-10 NOTE — Telephone Encounter (Signed)
Spoke with patient informed of second abx sent to pharmacy.

## 2022-10-21 MED ORDER — HYDROCODONE BIT-HOMATROP MBR 5-1.5 MG/5ML PO SOLN: 5.0000 mL | Freq: Three times a day (TID) | ORAL | 0 refills | Status: DC | PRN

## 2022-10-21 NOTE — Telephone Encounter (Signed)
Orders Placed This Encounter  Procedures   DG Chest 2 View    Standing Status:   Future    Number of Occurrences:   1    Standing Expiration Date:   10/08/2023    Order Specific Question:   Reason for Exam (SYMPTOM  OR DIAGNOSIS REQUIRED)    Answer:   cough    Order Specific Question:   Is patient pregnant?    Answer:   No    Order Specific Question:   Preferred imaging location?    Answer:   [facility]   Ambulatory referral to ENT    Referral Priority:   Routine    Referral Type:   Consultation    Referral Reason:   Specialty Services Required    Requested Specialty:   Otolaryngology    Number of Visits Requested:   1

## 2022-10-21 NOTE — Progress Notes (Signed)
HI Pamela Stevens, the x-ray looks good.  No sign of infection or nodules or fluid in the lungs which is great.  It does mention that your aorta has a pretty sharp curved to it called tortuosity.  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  We do not necessarily need to work it up further but they did note it.  Still having the vertigo please let me know and would like to refer you to ENT for further workup for possible Mnire's or vestibulitis.

## 2022-10-21 NOTE — Telephone Encounter (Signed)
Patient informed of x-ray results and referral information.

## 2022-10-21 NOTE — Telephone Encounter (Signed)
Attempted call to patient. Left voice mail message requesting a return call.  

## 2022-10-21 NOTE — Addendum Note (Signed)
Addended by: Nani Gasser D on: 10/21/2022 01:01 PM   Modules accepted: Orders

## 2022-10-21 NOTE — Progress Notes (Signed)
Sent over some cough medicine, keep throat well-hydrated, run humidifier if possible.  If she still having issues when she gets back then the plan will be to refer her to pulmonology especially since everything looks good on the exam.  Can consider a chest CT at that point in time.

## 2022-11-05 NOTE — Telephone Encounter (Signed)
Please call [facility] and see what is going on and why they have not contacted the patient.  It looks like the referral was sent 2 weeks ago.

## 2022-11-11 ENCOUNTER — Telehealth: Payer: Self-pay | Admitting: Family Medicine

## 2022-11-11 NOTE — Telephone Encounter (Signed)
Pt called. She is still coughing a lot although she is taking the hydrocodone(almost out of med) at night. In the morning she wakes up groggy. Water helps but she cannot drink water 24 hrs a day. Please advise.

## 2022-11-12 ENCOUNTER — Other Ambulatory Visit: Payer: Self-pay | Admitting: Family Medicine

## 2022-11-12 NOTE — Telephone Encounter (Signed)
Spoke w/pt and she is ok with pulmonology appt. She is going to check her insurance for which pulmonologist is covered and let us know I will fwd to pcp about whether or not she will proceed with Chest CT.

## 2022-11-18 ENCOUNTER — Telehealth: Payer: Self-pay | Admitting: Family Medicine

## 2022-11-18 NOTE — Telephone Encounter (Signed)
Patient called to request a refill for Hydrocodone 5ml syrup  [id number] - [place], [place] - [address]  Phone [phone]

## 2022-11-19 MED ORDER — HYDROCODONE BIT-HOMATROP MBR 5-1.5 MG/5ML PO SOLN: 5.0000 mL | Freq: Every evening | ORAL | 0 refills | Status: DC | PRN

## 2022-11-19 NOTE — Addendum Note (Signed)
Addended by: Nani Gasser D on: 11/19/2022 01:10 PM   Modules accepted: Orders

## 2022-11-19 NOTE — Telephone Encounter (Signed)
Meds ordered this encounter  Medications   HYDROcodone bit-homatropine (HYCODAN) 5-1.5 MG/5ML syrup    Sig: Take 5 mLs by mouth at bedtime as needed for cough.    Dispense:  60 mL    Refill:  0

## 2022-11-26 ENCOUNTER — Other Ambulatory Visit: Payer: Self-pay | Admitting: *Deleted

## 2022-11-26 DIAGNOSIS — R059 Cough, unspecified: Secondary | ICD-10-CM

## 2022-11-26 DIAGNOSIS — R052 Subacute cough: Secondary | ICD-10-CM

## 2022-11-26 NOTE — Telephone Encounter (Signed)
Good morning, I am unable to located referral orders for Pulmonary Specialist. Can you please enter orders? Pt would like to see Dr. Durel Stevens, Please advise.

## 2022-11-28 ENCOUNTER — Other Ambulatory Visit: Payer: Self-pay | Admitting: Family Medicine

## 2022-11-28 DIAGNOSIS — Z566 Other physical and mental strain related to work: Secondary | ICD-10-CM

## 2022-11-28 DIAGNOSIS — I10 Essential (primary) hypertension: Secondary | ICD-10-CM

## 2022-12-25 ENCOUNTER — Encounter (HOSPITAL_BASED_OUTPATIENT_CLINIC_OR_DEPARTMENT_OTHER): Payer: Self-pay

## 2022-12-31 DIAGNOSIS — H8113 Benign paroxysmal vertigo, bilateral: Secondary | ICD-10-CM | POA: Diagnosis not present

## 2022-12-31 DIAGNOSIS — H93293 Other abnormal auditory perceptions, bilateral: Secondary | ICD-10-CM | POA: Diagnosis not present

## 2023-06-07 ENCOUNTER — Other Ambulatory Visit: Payer: Self-pay | Admitting: Family Medicine

## 2023-06-07 DIAGNOSIS — I10 Essential (primary) hypertension: Secondary | ICD-10-CM

## 2023-06-08 NOTE — Telephone Encounter (Signed)
 Please call pt she will need an appt to f/u on bp and medications.thanks.

## 2023-06-08 NOTE — Telephone Encounter (Signed)
 Called patient, LVM to call office to schedule follow up on BP, thanks.

## 2023-07-08 ENCOUNTER — Other Ambulatory Visit: Payer: Self-pay | Admitting: Family Medicine

## 2023-07-08 DIAGNOSIS — I10 Essential (primary) hypertension: Secondary | ICD-10-CM

## 2023-09-11 ENCOUNTER — Other Ambulatory Visit: Payer: Self-pay | Admitting: Family Medicine

## 2023-09-11 DIAGNOSIS — I10 Essential (primary) hypertension: Secondary | ICD-10-CM

## 2023-09-11 NOTE — Telephone Encounter (Signed)
 Called pt, LVM.

## 2023-09-11 NOTE — Telephone Encounter (Signed)
 Please call pt she will need an OV for refills on her bp medication. Thank you

## 2023-09-18 NOTE — Telephone Encounter (Signed)
 Plz call pt for appt. TY

## 2023-09-18 NOTE — Telephone Encounter (Signed)
 Patient states that she got her amlodipine  yesterday from pharmacy, and she has appt scheduled for 10/29/23, thanks.

## 2023-10-04 IMAGING — US US RENAL
1 series · 14 of 25 positions shown · non-contrast
Comparison: None Available.

CLINICAL DATA: Elevated creatinine.

EXAM:
RENAL / URINARY TRACT ULTRASOUND COMPLETE

[Series 1: us renal · 14 of 39 slices shown]
[im 1/39]
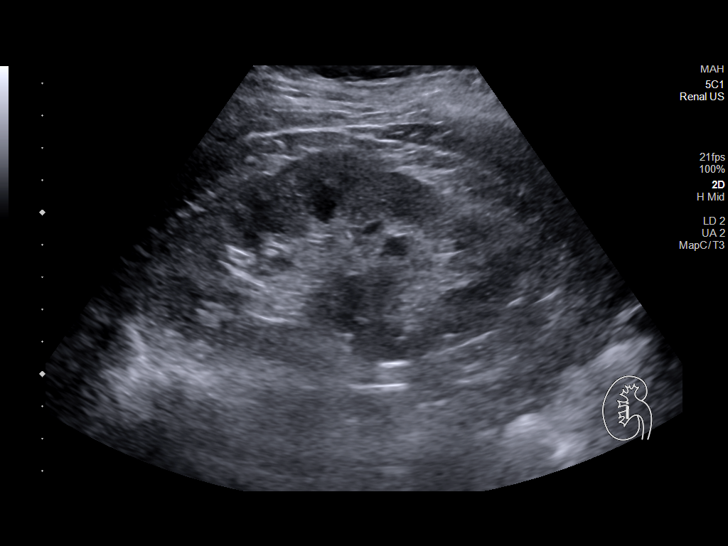
[im 4/39]
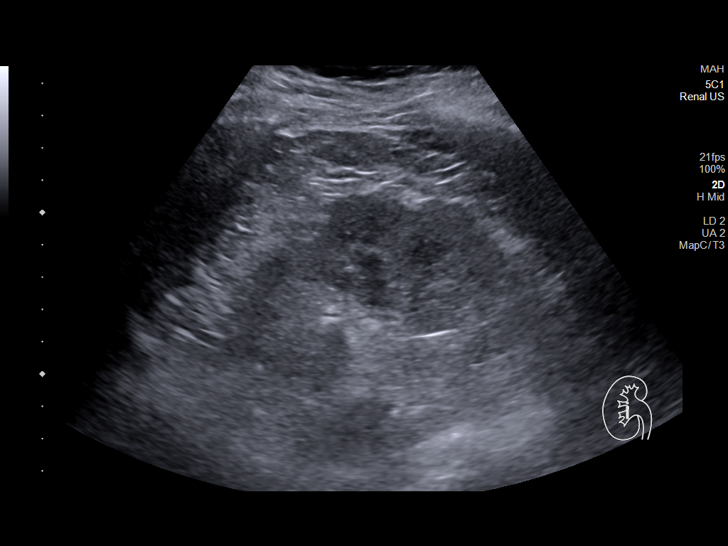
[im 7/39]
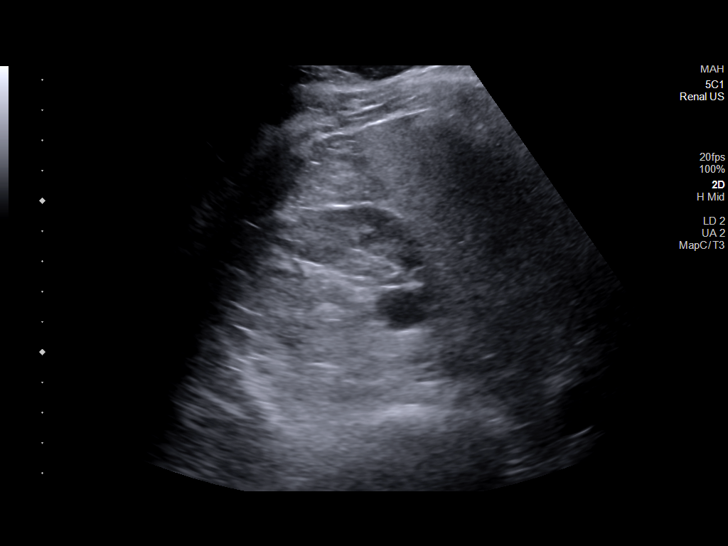
[im 10/39]
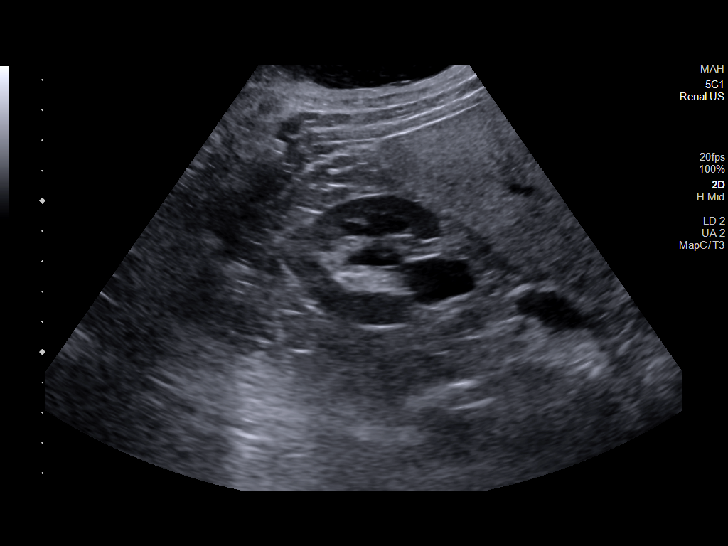
[im 13/39]
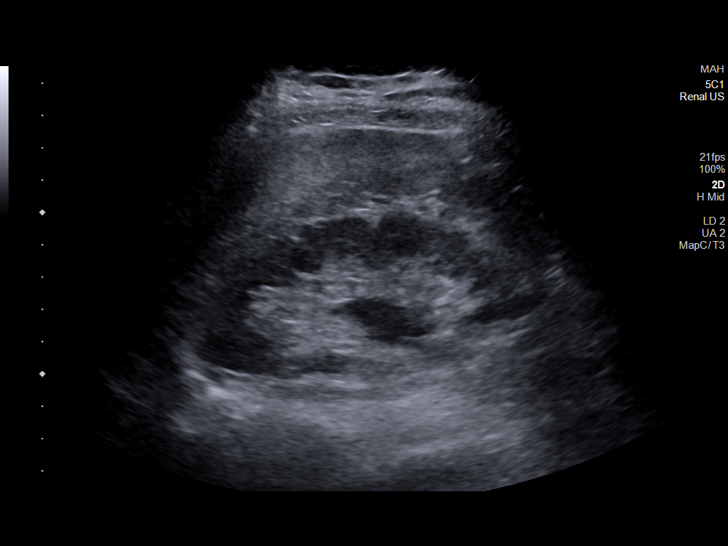
[im 15/39]
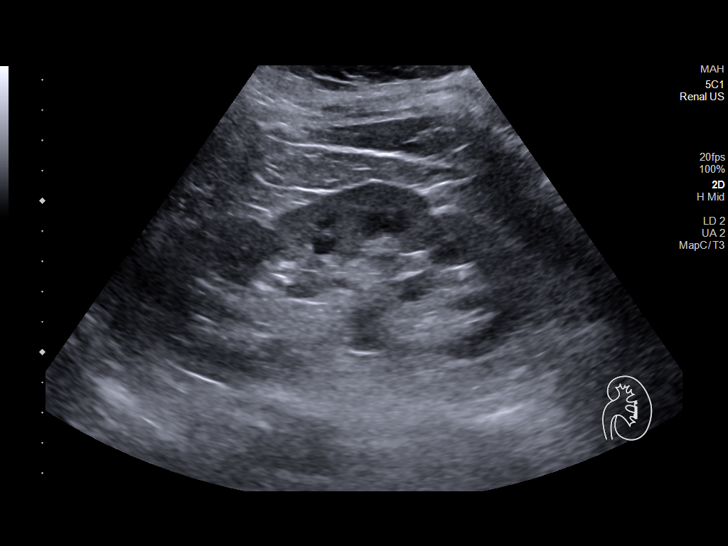
[im 18/39]
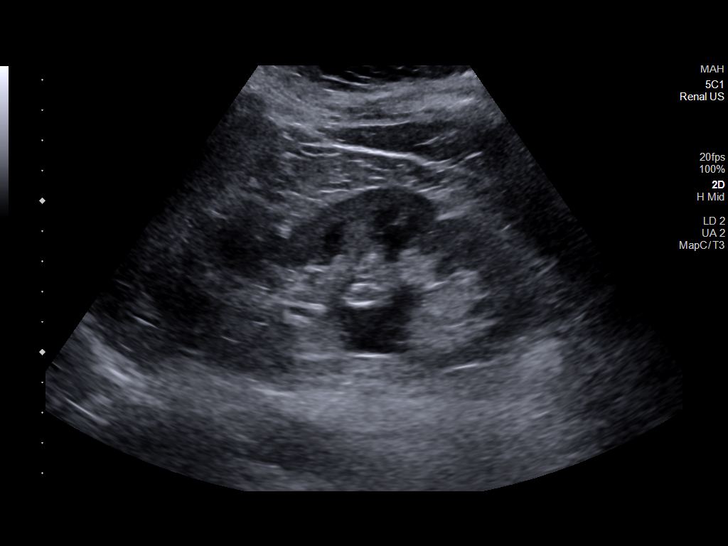
[im 21/39]
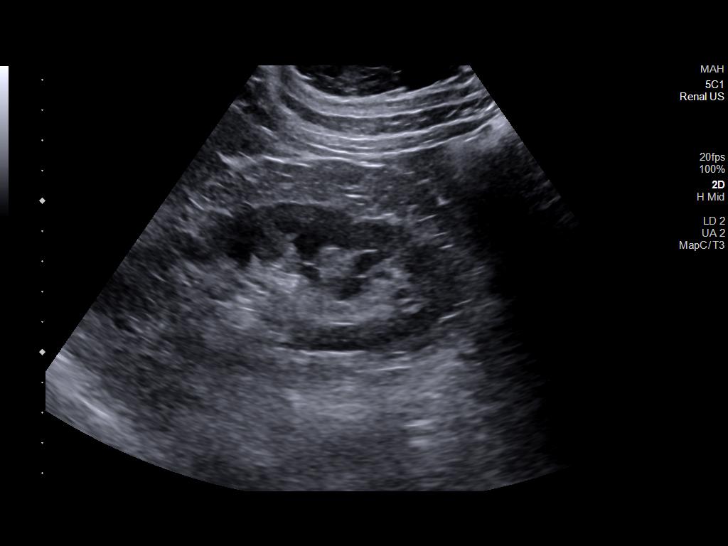
[im 24/39]
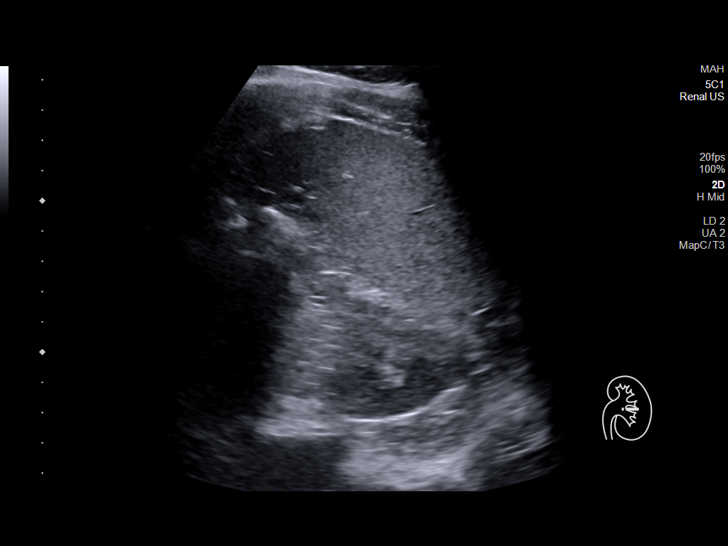
[im 26/39]
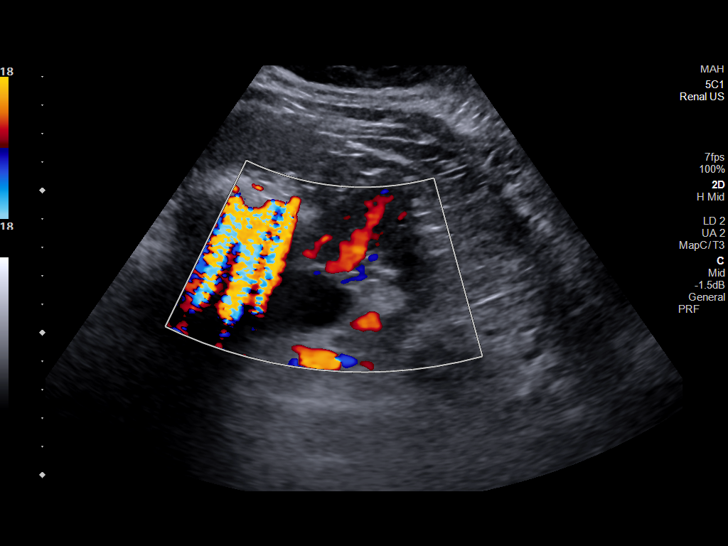
[im 29/39]
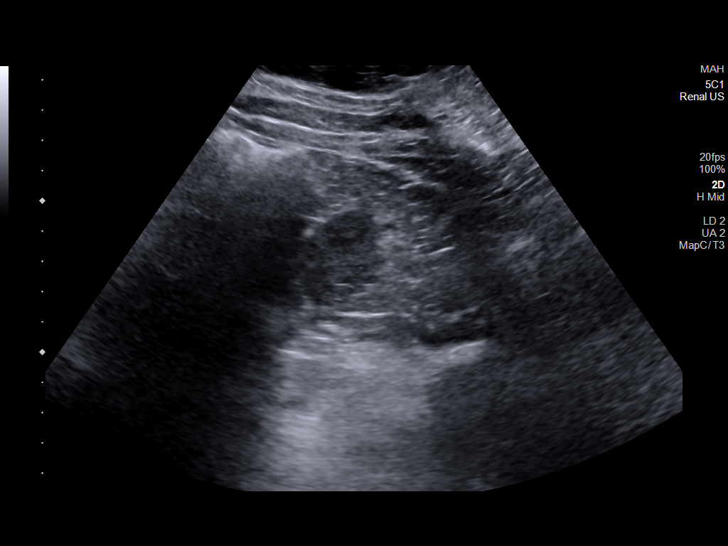
[im 32/39]
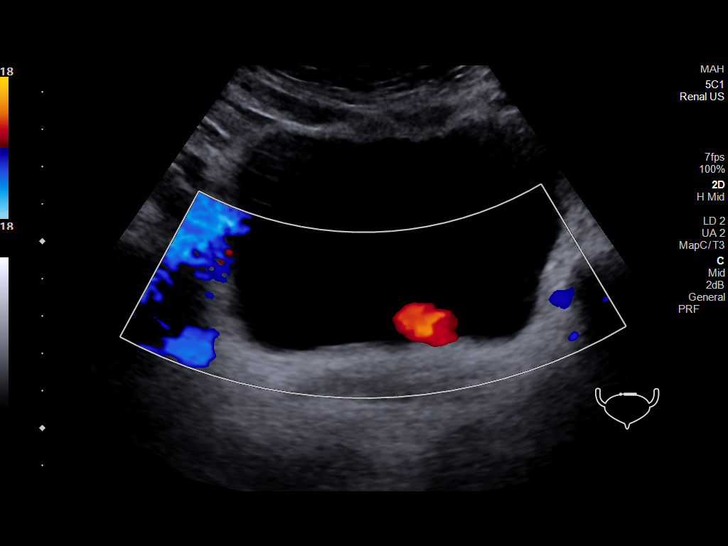
[im 35/39]
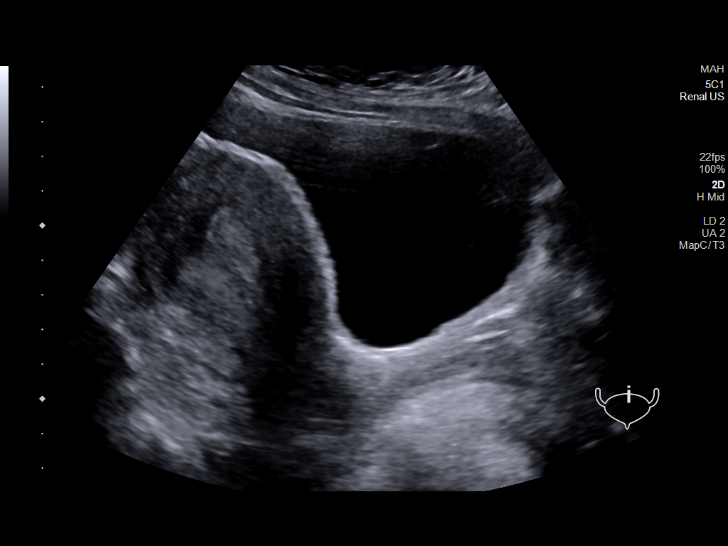
[im 39/39]
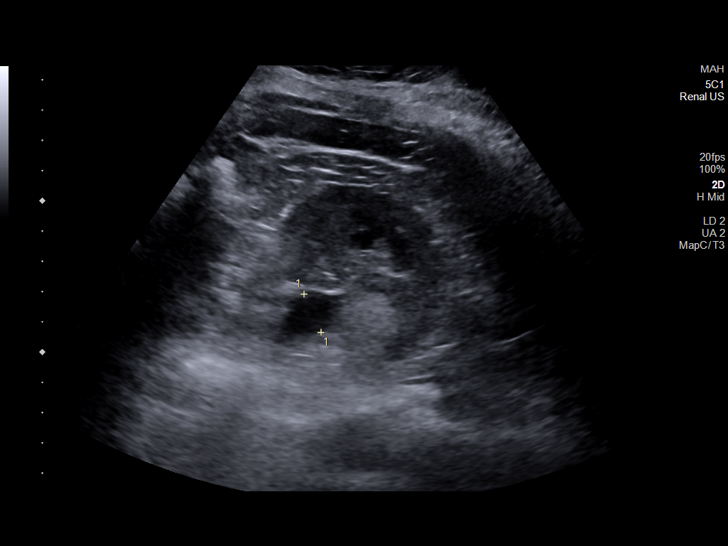

[14 of 25 positions shown; findings below may reference images not displayed]

FINDINGS: Right Kidney:

Renal measurements: 12.2 x 4.4 x 5.6 cm = volume: 155.9 mL. Mild
increased renal cortical echogenicity. Normal renal cortical
thickness. Mild pelviectasis.

Left Kidney:

Renal measurements: 12.9 x 4.9 x 5.2 cm = volume: 181.3 mL. Mild
increased renal cortical echogenicity. Normal renal cortical
thickness. Mild pelviectasis.

Bladder:

Appears normal for degree of bladder distention.

Other:

None.
IMPRESSION: Mild increased renal cortical echogenicity as can be seen with
chronic medical renal disease.

Mild bilateral pelviectasis.

## 2023-10-21 ENCOUNTER — Ambulatory Visit: Payer: Self-pay

## 2023-10-21 NOTE — Telephone Encounter (Signed)
 FYI Only or Action Required?: Action required by provider: request for appointment and clinical question for provider.  Patient was last seen in primary care on 09/22/2022 by Alvan Dorothyann BIRCH, MD.  Called Nurse Triage reporting Dysuria.  Symptoms began June 2025.  Interventions attempted: Rest, hydration, or home remedies.  Symptoms are: gradually worsening.  Triage Disposition: Call PCP Within 24 Hours  Patient/caregiver understands and will follow disposition?: Yes   Copied from CRM [id number]. Topic: Clinical - Red Word Triage >> Oct 21, 2023  8:15 AM Zane F wrote: [facility] that prompted transfer to Nurse Triage:   Mid back pain; abdominal pain; burning sensation while urinating; sound odor and cloudy urine Reason for Disposition  [1] Painful urination AND [2] EITHER frequency or urgency AND [3] has on-call doctor  Answer Assessment - Initial Assessment Questions 1. SEVERITY: How bad is the pain?  (e.g., Scale 1-10; mild, moderate, or severe)     moderate 2. FREQUENCY: How many times have you had painful urination today?      Each time 3. PATTERN: Is pain present every time you urinate or just sometimes?      constant 4. ONSET: When did the painful urination start?      1.5 weeks 5. FEVER: Do you have a fever? If Yes, ask: What is your temperature, how was it measured, and when did it start?     Fever - warm to touch 6. PAST UTI: Have you had a urine infection before? If Yes, ask: When was the last time? and What happened that time?      Yes, ABX 7. CAUSE: What do you think is causing the painful urination?  (e.g., UTI, scratch, Herpes sore)     uti 8. OTHER SYMPTOMS: Do you have any other symptoms? (e.g., blood in urine, flank pain, genital sores, urgency, vaginal discharge)     Fever, chills, flank & abd pain 5/10, strong ordor, cloudy urine 9. PREGNANCY: Is there any chance you are pregnant? When was your last menstrual period?      na  Protocols used: Urination Pain - Female-A-AH

## 2023-10-22 ENCOUNTER — Other Ambulatory Visit: Payer: Self-pay | Admitting: Family Medicine

## 2023-10-22 ENCOUNTER — Encounter: Payer: Self-pay | Admitting: Family Medicine

## 2023-10-22 ENCOUNTER — Ambulatory Visit (INDEPENDENT_AMBULATORY_CARE_PROVIDER_SITE_OTHER): Payer: Self-pay | Admitting: Family Medicine

## 2023-10-22 VITALS — BP 122/81 | HR 80 | Ht 66.0 in | Wt 172.2 lb

## 2023-10-22 DIAGNOSIS — R051 Acute cough: Secondary | ICD-10-CM | POA: Diagnosis not present

## 2023-10-22 DIAGNOSIS — N390 Urinary tract infection, site not specified: Secondary | ICD-10-CM

## 2023-10-22 DIAGNOSIS — Z0000 Encounter for general adult medical examination without abnormal findings: Secondary | ICD-10-CM

## 2023-10-22 LAB — POCT URINALYSIS DIP (CLINITEK)
Bilirubin, UA: NEGATIVE
Glucose, UA: NEGATIVE mg/dL
Ketones, POC UA: NEGATIVE mg/dL
Nitrite, UA: NEGATIVE
POC PROTEIN,UA: 100 — AB
Spec Grav, UA: 1.02 (ref 1.010–1.025)
Urobilinogen, UA: 0.2 U/dL
pH, UA: 6 (ref 5.0–8.0)

## 2023-10-22 MED ORDER — CEFPODOXIME PROXETIL 200 MG PO TABS: 200.0000 mg | ORAL_TABLET | Freq: Two times a day (BID) | ORAL | 0 refills | Status: AC

## 2023-10-22 NOTE — Progress Notes (Signed)
 Lab orders placed for physical for next week.

## 2023-10-22 NOTE — Patient Instructions (Signed)
 Recommend to use your saline nasal spray and gargles.  Stay well-hydrated.  Keep throat moisturized.

## 2023-10-22 NOTE — Progress Notes (Signed)
 Acute Office Visit  Subjective:     Patient ID: Pamela Stevens, female    DOB: Aug 20, 1970, 53 y.o.   MRN: [id number]  Chief Complaint  Patient presents with   Urinary Tract Infection    HPI Patient is in today for > 1 week with fever, chills and back pain and odorous urine. Was having some urinary regency but that is better now.  She has been having some back pain on both sides in the low back area.  Did start experiencing a little bit of a cough last night.  And had some sharp pains in her right ear last night.  ROS      Objective:    BP 122/81   Pulse 80   Ht 5' 6 (1.676 m)   Wt 172 lb 3.2 oz (78.1 kg)   SpO2 99%   BMI 27.79 kg/m    Physical Exam Constitutional:      Appearance: Normal appearance.  HENT:     Head: Normocephalic and atraumatic.     Right Ear: Tympanic membrane, ear canal and external ear normal. There is no impacted cerumen.     Left Ear: Tympanic membrane, ear canal and external ear normal. There is no impacted cerumen.     Nose: Nose normal.     Mouth/Throat:     Pharynx: Oropharynx is clear.  Eyes:     Conjunctiva/sclera: Conjunctivae normal.  Cardiovascular:     Rate and Rhythm: Normal rate and regular rhythm.  Pulmonary:     Effort: Pulmonary effort is normal.     Breath sounds: Normal breath sounds.  Musculoskeletal:     Cervical back: Neck supple. No tenderness.  Lymphadenopathy:     Cervical: No cervical adenopathy.  Skin:    General: Skin is warm and dry.  Neurological:     Mental Status: She is alert and oriented to person, place, and time.  Psychiatric:        Mood and Affect: Mood normal.     Results for orders placed or performed in visit on 10/22/23  POCT URINALYSIS DIP (CLINITEK)  Result Value Ref Range   Color, UA yellow yellow   Clarity, UA cloudy (A) clear   Glucose, UA negative negative mg/dL   Bilirubin, UA negative negative   Ketones, POC UA negative negative mg/dL   Spec Grav, UA 8.979 8.989 -  1.025   Blood, UA moderate (A) negative   pH, UA 6.0 5.0 - 8.0   POC PROTEIN,UA =100 (A) negative, trace   Urobilinogen, UA 0.2 0.2 or 1.0 E.U./dL   Nitrite, UA Negative Negative   Leukocytes, UA Moderate (2+) (A) Negative        Assessment & Plan:   Problem List Items Addressed This Visit   None Visit Diagnoses       Complicated UTI (urinary tract infection)    -  Primary   Relevant Medications   cefpodoxime  (VANTIN ) 200 MG tablet   Other Relevant Orders   POCT URINALYSIS DIP (CLINITEK) (Completed)   Urine Culture     Acute cough          Complicated UTI with fever anb back pain.  Will treat with cefpodoxime .  X 10 days.  Will send urine specimen for culture for further evaluation.  Cough - Recommend to use your saline nasal spray and gargles.  Stay well-hydrated.  Keep throat moisturized.  Labs ordered for CPE for next week.    Meds ordered this encounter  Medications  cefpodoxime  (VANTIN ) 200 MG tablet    Sig: Take 1 tablet (200 mg total) by mouth 2 (two) times daily for 10 days.    Dispense:  20 tablet    Refill:  0    No follow-ups on file.  Dorothyann Byars, MD

## 2023-10-23 ENCOUNTER — Ambulatory Visit: Payer: Self-pay | Admitting: Family Medicine

## 2023-10-23 LAB — CBC
Hematocrit: 38.6 % (ref 34.0–46.6)
Hemoglobin: 12.6 g/dL (ref 11.1–15.9)
MCH: 32.9 pg (ref 26.6–33.0)
MCHC: 32.6 g/dL (ref 31.5–35.7)
MCV: 101 fL — ABNORMAL HIGH (ref 79–97)
Platelets: 217 x10E3/uL (ref 150–450)
RBC: 3.83 x10E6/uL (ref 3.77–5.28)
RDW: 12.3 % (ref 11.7–15.4)
WBC: 2.3 x10E3/uL — CL (ref 3.4–10.8)

## 2023-10-23 LAB — LIPID PANEL
Chol/HDL Ratio: 3.7 ratio (ref 0.0–4.4)
Cholesterol, Total: 151 mg/dL (ref 100–199)
HDL: 41 mg/dL (ref >39)
LDL Chol Calc (NIH): 90 mg/dL (ref 0–99)
Triglycerides: 109 mg/dL (ref 0–149)
VLDL Cholesterol Cal: 20 mg/dL (ref 5–40)

## 2023-10-23 LAB — CMP14+EGFR
ALT: 14 IU/L (ref 0–32)
AST: 21 IU/L (ref 0–40)
Albumin: 4.4 g/dL (ref 3.8–4.9)
Alkaline Phosphatase: 34 IU/L — ABNORMAL LOW (ref 44–121)
BUN/Creatinine Ratio: 27 — ABNORMAL HIGH (ref 9–23)
BUN: 23 mg/dL (ref 6–24)
Bilirubin Total: 0.5 mg/dL (ref 0.0–1.2)
CO2: 21 mmol/L (ref 20–29)
Calcium: 9.3 mg/dL (ref 8.7–10.2)
Chloride: 107 mmol/L — ABNORMAL HIGH (ref 96–106)
Creatinine, Ser: 0.85 mg/dL (ref 0.57–1.00)
Globulin, Total: 1.9 g/dL (ref 1.5–4.5)
Glucose: 92 mg/dL (ref 70–99)
Potassium: 4.9 mmol/L (ref 3.5–5.2)
Sodium: 143 mmol/L (ref 134–144)
Total Protein: 6.3 g/dL (ref 6.0–8.5)
eGFR: 82 mL/min/1.73 (ref >59)

## 2023-10-23 LAB — HEMOGLOBIN A1C
Est. average glucose Bld gHb Est-mCnc: 117 mg/dL
Hgb A1c MFr Bld: 5.7 % — ABNORMAL HIGH (ref 4.8–5.6)

## 2023-10-23 NOTE — Progress Notes (Signed)
 Hi Pamela Stevens your hemoglobin looks great at 12.6.  Your white blood cells were a little low which is unusual for you typically you run between 7 and 8 when you are not sick.  I would like to recheck that in about 2 weeks to verify.  Sometimes viruses can cause some temporary suppression.  Kidney function looks good.  AST and ALT liver enzymes are normal.  A1c was 5.7 so actually looks better than last time.  And your cholesterol and triglycerides look great.

## 2023-10-25 LAB — URINE CULTURE

## 2023-10-26 ENCOUNTER — Ambulatory Visit: Payer: Self-pay | Admitting: Family Medicine

## 2023-10-26 NOTE — Progress Notes (Signed)
 HI Thersea,   The urine culture came back positive for E. coli it is sensitive to the antibiotics that were tested.  Hopefully you are feeling much better.  If not then please let us  know.

## 2023-10-29 ENCOUNTER — Ambulatory Visit (INDEPENDENT_AMBULATORY_CARE_PROVIDER_SITE_OTHER): Payer: Self-pay | Admitting: Family Medicine

## 2023-10-29 ENCOUNTER — Encounter: Payer: Self-pay | Admitting: Family Medicine

## 2023-10-29 VITALS — BP 119/80 | HR 87 | Ht 66.0 in | Wt 172.0 lb

## 2023-10-29 DIAGNOSIS — I10 Essential (primary) hypertension: Secondary | ICD-10-CM | POA: Diagnosis not present

## 2023-10-29 DIAGNOSIS — R0789 Other chest pain: Secondary | ICD-10-CM

## 2023-10-29 DIAGNOSIS — I351 Nonrheumatic aortic (valve) insufficiency: Secondary | ICD-10-CM | POA: Insufficient documentation

## 2023-10-29 DIAGNOSIS — R200 Anesthesia of skin: Secondary | ICD-10-CM

## 2023-10-29 DIAGNOSIS — R0981 Nasal congestion: Secondary | ICD-10-CM

## 2023-10-29 DIAGNOSIS — R202 Paresthesia of skin: Secondary | ICD-10-CM

## 2023-10-29 DIAGNOSIS — M549 Dorsalgia, unspecified: Secondary | ICD-10-CM

## 2023-10-29 NOTE — Progress Notes (Signed)
 Acute Office Visit  Subjective:     Patient ID: Pamela Stevens, female    DOB: 01/10/1971, 53 y.o.   MRN: [id number]  Chief Complaint  Patient presents with   pinched nerve    Pt reports that she started experiencing this about 2 months ago. She stated that it is behind her L shoulder blade and radiates downwards. She said that it is uncomfortable and the feeling takes some time to go away. It doesn't occur on a daily basis.    Chest Pain    Pt reports that she has been experiencing some chest pressure. She questions if this is due to congestion since she had some sinus congestion or if it is due to her leaky valve. She stated that she gets a sudden onset of being tired/winded at times.  she also asked when should she see cardiology    HPI Patient is in today for   Pain in her back that started 2 months ago Pt reports that she started experiencing this about 2 months ago. She stated that it is behind her L shoulder blade and radiates downwards into her left arm.. She said that it is uncomfortable and the feeling takes some time to go away. It doesn't occur on a daily basis.  She feels like sometimes when it is really bad she will get some numbness and tingling down her left leg as well.  She has a little stiffness in her neck today but normally does not have any persistent neck pain.  She is right-handed.  Pt reports that she has been experiencing some chest pressure x 1.5 months. . She questions if this is due to congestion since she had some sinus congestion or if it is due to her leaky valve. She stated that she gets a sudden onset of being tired/winded at times. she also asked when should she see cardiology   Still feels congestion in her nose and chest, cough is better.  She also feels like she still having issues with feeling a little off balance or dizzy.  It happens sometimes when laying down.  It tends to come and go.  She still been taking her Claritin regularly.  She  is also having episodes of just feeling suddenly fatigued and tired she said she even had a day where she ended up just coming home feeling exhausted and going to bed early which is not like her at all.  ROS      Objective:    BP 119/80   Pulse 87   Ht 5' 6 (1.676 m)   Wt 172 lb (78 kg)   SpO2 100%   BMI 27.76 kg/m    Physical Exam Constitutional:      Appearance: Normal appearance.  HENT:     Head: Normocephalic and atraumatic.     Right Ear: Tympanic membrane, ear canal and external ear normal. There is no impacted cerumen.     Left Ear: Tympanic membrane, ear canal and external ear normal. There is no impacted cerumen.     Nose: Nose normal.  Eyes:     Conjunctiva/sclera: Conjunctivae normal.  Cardiovascular:     Rate and Rhythm: Normal rate and regular rhythm.  Pulmonary:     Effort: Pulmonary effort is normal.     Breath sounds: Normal breath sounds.  Skin:    General: Skin is warm and dry.  Neurological:     Mental Status: She is alert and oriented to person, place, and time.  Psychiatric:        Mood and Affect: Mood normal.     No results found for any visits on 10/29/23.      Assessment & Plan:   Problem List Items Addressed This Visit       Cardiovascular and Mediastinum   Hypertension, goal < 120/80   Pressure honestly looks good.  I reviewed the last nephrology notes from [facility].  He really wants her blood pressure less than 120/80 so for the most part it is at goal so we will continue current regimen with amlodipine  and 12.5 mg of HCTZ.      Relevant Orders   EKG   Aortic valve regurgitation - Primary   I went back and looked at the echocardiogram from 2 years ago and we have placed a note to repeat in 2 years so we will go ahead and get that ordered I do not think that is causing any the chest discomfort.      Relevant Orders   ECHOCARDIOGRAM COMPLETE   Other Visit Diagnoses       Sinus congestion       Relevant Orders   CT SINUS WO  CONTRAST     Numbness and tingling in left arm       Relevant Orders   DG Cervical Spine Complete   DG Thoracic Spine W/Swimmers     Upper back pain on left side       Relevant Orders   DG Cervical Spine Complete   DG Thoracic Spine W/Swimmers     Atypical chest pain       Relevant Orders   EKG      Atypical chest pain-I feel like it is more related to the congestion it seems to be more bothersome at night when she lays down but it is not an issue with being upright or with activity so it is unlikely to be cardiac but we did go ahead and do an EKG today.  Will also go ahead and get the echocardiogram updated.  This could be related to some of the chronic sinus congestion  Chronic sinus congestion-she has previously seen ENT but does not felt like she got a good response from them so Ragona go ahead and move forward with CT of the sinuses for further workup.  For the left upper back pain with occasional radiculopathy down the left arm and like to start with a plain film of her neck and thoracic spine.  Could consider a trial of a muscle relaxer which could be helpful for pain and discomfort or anti-inflammatory.  She has been working with a massage therapist sometimes she gets little relief with that sometimes she does not.  EKG shows rate of 80 bpm, sinus rhythm.  Looks like an old Q wave in lead III.  Seen previously on EKG from February 2024.  Poor R wave progression in the lateral leads.   No orders of the defined types were placed in this encounter.  I spent 40 minutes on the day of the encounter to include pre-visit record review, face-to-face time with the patient and post visit ordering of test.   No follow-ups on file.  Dorothyann Byars, MD

## 2023-10-29 NOTE — Assessment & Plan Note (Signed)
 I went back and looked at the echocardiogram from 2 years ago and we have placed a note to repeat in 2 years so we will go ahead and get that ordered I do not think that is causing any the chest discomfort.

## 2023-10-29 NOTE — Assessment & Plan Note (Signed)
 Pressure honestly looks good.  I reviewed the last nephrology notes from [facility].  He really wants her blood pressure less than 120/80 so for the most part it is at goal so we will continue current regimen with amlodipine  and 12.5 mg of HCTZ.

## 2023-10-30 NOTE — Addendum Note (Signed)
 Addended by: BONNY JON DEL on: 10/30/2023 08:53 AM   Modules accepted: Orders

## 2023-11-03 ENCOUNTER — Telehealth: Payer: Self-pay

## 2023-11-03 DIAGNOSIS — I10 Essential (primary) hypertension: Secondary | ICD-10-CM

## 2023-11-03 MED ORDER — AMLODIPINE BESYLATE 10 MG PO TABS: 10.0000 mg | ORAL_TABLET | Freq: Every day | ORAL | 1 refills | Status: AC

## 2023-11-03 MED ORDER — HYDROCHLOROTHIAZIDE 12.5 MG PO TABS: 12.5000 mg | ORAL_TABLET | Freq: Every day | ORAL | 1 refills | Status: AC

## 2023-11-03 NOTE — Telephone Encounter (Signed)
 Lainey ask if Dr Alvan has had a chance to look at her medications to see if she could stop taking some of the blood pressure medications.

## 2023-11-03 NOTE — Telephone Encounter (Signed)
 Pamela Stevens states she will call to schedule a mammogram.

## 2023-11-03 NOTE — Telephone Encounter (Signed)
 BP  looks good on regimen and I did go back through the nephrology notes and I really do think she needs to continue her regimen.

## 2023-11-03 NOTE — Telephone Encounter (Signed)
 Patient advised. Medications refilled.

## 2023-11-06 ENCOUNTER — Ambulatory Visit (INDEPENDENT_AMBULATORY_CARE_PROVIDER_SITE_OTHER)

## 2023-11-06 ENCOUNTER — Ambulatory Visit

## 2023-11-06 DIAGNOSIS — M5410 Radiculopathy, site unspecified: Secondary | ICD-10-CM | POA: Diagnosis not present

## 2023-11-06 DIAGNOSIS — R0981 Nasal congestion: Secondary | ICD-10-CM | POA: Diagnosis not present

## 2023-11-06 DIAGNOSIS — M549 Dorsalgia, unspecified: Secondary | ICD-10-CM

## 2023-11-06 DIAGNOSIS — R202 Paresthesia of skin: Secondary | ICD-10-CM

## 2023-11-12 ENCOUNTER — Ambulatory Visit (HOSPITAL_COMMUNITY): Admission: RE | Admit: 2023-11-12 | Source: Ambulatory Visit | Attending: Family Medicine | Admitting: Family Medicine

## 2023-11-18 ENCOUNTER — Ambulatory Visit: Payer: Self-pay | Admitting: Family Medicine

## 2023-11-18 DIAGNOSIS — R0981 Nasal congestion: Secondary | ICD-10-CM

## 2023-11-18 NOTE — Progress Notes (Signed)
 Hi Pamela Stevens, your CT sinuses came back.  They did not see any chronic sinus infection which is good so that at least answers the question of whether or not you need more antibiotics.  You do not.  They did see some septal deviation to the left side.  And they also saw that one of the middle turbinates has air cells in it.  This can also cause some nasal obstruction and facial pain and headaches.  So I think neck step at this point would be to refer you to ENT.  I have placed a referral for you a couple of weeks ago for ENT not sure if they have called you to schedule yet.

## 2023-11-18 NOTE — Telephone Encounter (Signed)
 Sorry I saw the one from July 2024.     Just put in new referral.   Orders Placed This Encounter  Procedures   Ambulatory referral to ENT    Referral Priority:   Routine    Referral Type:   Consultation    Referral Reason:   Specialty Services Required    Requested Specialty:   Otolaryngology    Number of Visits Requested:   1

## 2023-11-20 NOTE — Progress Notes (Signed)
 HI Pamela Stevens,  The x-ray of your neck shows some muscle spasm.  It mentions reversal of lordosis that basically means that the muscles are so tight that it is changing the curvature of your neck.  Working on gentle stretches, formal physical therapy and sometimes even a muscle relaxer can be helpful for this.  You do have a little bit of moderate arthritis at C5-6 and C6-7 which is more towards the bottom of your neck.  They did notice a little bit of narrowing where the nerve root comes out possibly at C5-6 and C6-7 as well.  This could be explaining some of the discomfort that is radiating down the left arm.  Current recommendation is for 6 weeks of physical therapy either at home or formally and then if not improving we can move forward with an MRI.  Let me know what you would like to do.

## 2023-11-20 NOTE — Progress Notes (Signed)
 Hi Pamela Stevens, your xray of your back looks ok overall. No signs of advance arthritis, etc.  I would recommend formal Physical therapy if you are still having a pain.

## 2023-11-24 ENCOUNTER — Other Ambulatory Visit: Payer: Self-pay | Admitting: Family Medicine

## 2023-11-24 DIAGNOSIS — R200 Anesthesia of skin: Secondary | ICD-10-CM

## 2023-11-24 DIAGNOSIS — M549 Dorsalgia, unspecified: Secondary | ICD-10-CM

## 2023-11-24 NOTE — Progress Notes (Signed)
 Referral placed.

## 2023-11-29 NOTE — Therapy (Unsigned)
 OUTPATIENT PHYSICAL THERAPY CERVICAL EVALUATION   Patient Name: Pamela Stevens MRN: [id number] DOB:Aug 10, 1970, 53 y.o., female Today's Date: 11/29/2023  END OF SESSION:   Past Medical History:  Diagnosis Date   Allergy     DIABETES MELLITUS, GESTATIONAL, INSULIN -DEPENDENT 03/05/2007   Qualifier: Diagnosis of  By: Waylan DO, Karen     Eczema    stress induced   Exercise-induced asthma    AS A CHILD   Gestational diabetes    Hypertension    PCOS (polycystic ovarian syndrome)    Past Surgical History:  Procedure Laterality Date   NO PAST SURGERIES     WISDOM TOOTH EXTRACTION     Patient Active Problem List   Diagnosis Date Noted   Aortic valve regurgitation 10/29/2023   Eczema 04/11/2022   Exercise-induced asthma 04/11/2022   Lactose intolerance 04/11/2022   Constipation 01/27/2022   Frequent headaches 11/26/2021   Other insomnia 11/26/2021   Moderate tricuspid regurgitation 05/24/2021   Ascending aorta dilatation (HCC) 05/24/2021   Hyperlipidemia 01/10/2019   Persistent migraine aura without cerebral infarction and without status migrainosus, not intractable 09/15/2018   Hypertension, goal < 120/80 05/29/2017   Overweight (BMI 25.0-29.9) 05/29/2017   Stress at work 05/29/2017   IFG (impaired fasting glucose) 04/06/2017   Acne vulgaris 01/30/2016   Degenerative disc disease, cervical 10/04/2012   ULNAR NEUROPATHY 06/03/2010   CARPAL TUNNEL SYNDROME, LEFT 05/20/2010   DELAYED MENSES 06/17/2007   ALLERGIC RHINITIS, SEASONAL 05/03/2007   History of gestational diabetes 03/05/2007    PCP: Dr Dorothyann JONETTA Byars  REFERRING PROVIDER: Dr Dorothyann JONETTA Byars  REFERRING DIAG: numbness and tingling L arm; L upper back pain   THERAPY DIAG:  No diagnosis found.  Rationale for Evaluation and Treatment: Rehabilitation  ONSET DATE: ***  SUBJECTIVE:                                                                                                                                                                                                          SUBJECTIVE STATEMENT: *** Hand dominance: {MISC; OT HAND DOMINANCE:[phone]}  PERTINENT HISTORY:  Cervical DDD 2014-present, most prominent C5-6 with central stenosis; migraine; elevated cholostrol; moderate tricuspid regurgitation; ascending aorta dilatation; aortic valve regurgitation; Gestational diabetes; HTN; polycystic ovarian syndrome(PCOS); exercise induced asthma  PAIN:  Are you having pain? {OPRCPAIN:27236}  PRECAUTIONS: {Therapy precautions:24002}  RED FLAGS: {PT Red Flags:29287}     WEIGHT BEARING RESTRICTIONS: {Yes ***/No:24003}  FALLS:  Has patient fallen in last 6 months? {fallsyesno:27318}  LIVING ENVIRONMENT: Lives with: {[facility] lives with:25569::lives with their family} Lives  in: {Lives in:25570} Stairs: {opstairs:27293} Has following equipment at home: {Assistive devices:23999}  OCCUPATION: ***  PLOF: {PLOF:24004}  PATIENT GOALS: ***  NEXT MD VISIT: ***  OBJECTIVE:  Note: Objective measures were completed at Evaluation unless otherwise noted.  DIAGNOSTIC FINDINGS:  Xray cervical spine 11/19/23: Mild reversal of cervical lordosis. Vertebral body heights are maintained. Moderate disc space narrowing C5-C6 and C6-C7. Dens and lateral masses are within normal limits. Foraminal narrowing suspected at C5-C6 and C6-C7   IMPRESSION: Mild reversal of cervical lordosis with moderate degenerative changes at C5-C6 and C6-C7.  Xray thoracic spine w/swimmers 11/19/23: FINDINGS: There is no evidence of thoracic spine fracture. Alignment is normal. No other significant bone abnormalities are identified.   IMPRESSION: Negative.  PATIENT SURVEYS:  {rehab surveys:24030}  COGNITION: Overall cognitive status: Within functional limits for tasks assessed  SENSATION: {sensation:27233}  POSTURE: {posture:25561}  PALPATION: ***   CERVICAL ROM:   Active ROM A/PROM  (deg) eval  Flexion   Extension   Right lateral flexion   Left lateral flexion   Right rotation   Left rotation    (Blank rows = not tested)  UPPER EXTREMITY ROM:  Active ROM Right eval Left eval  Shoulder flexion    Shoulder extension    Shoulder abduction    Shoulder adduction    Shoulder extension    Shoulder internal rotation    Shoulder external rotation    Elbow flexion    Elbow extension    Wrist flexion    Wrist extension    Wrist ulnar deviation    Wrist radial deviation    Wrist pronation    Wrist supination     (Blank rows = not tested)  UPPER EXTREMITY MMT:  MMT Right eval Left eval  Shoulder flexion    Shoulder extension    Shoulder abduction    Shoulder adduction    Shoulder extension    Shoulder internal rotation    Shoulder external rotation    Middle trapezius    Lower trapezius    Elbow flexion    Elbow extension    Wrist flexion    Wrist extension    Wrist ulnar deviation    Wrist radial deviation    Wrist pronation    Wrist supination    Grip strength     (Blank rows = not tested)  CERVICAL SPECIAL TESTS:  Upper limb tension test (ULTT): {pos/neg:25243}, Spurling's test: {pos/neg:25243}, and Distraction test: {pos/neg:25243}  TREATMENT DATE: ***                                                                                                                                 PATIENT EDUCATION:  Education details: POC; HEP  Person educated: Patient Education method: Programmer, multimedia, Facilities manager, Actor cues, Verbal cues, and Handouts Education comprehension: verbalized understanding, returned demonstration, verbal cues required, tactile cues required, and needs further education  HOME EXERCISE PROGRAM: ***  ASSESSMENT:  CLINICAL IMPRESSION: Patient  is a 53 y.o. female who was seen today for physical therapy evaluation and treatment for numbness and tingling in the L arm; L sided upper back pain.   OBJECTIVE IMPAIRMENTS:  {opptimpairments:25111}.   ACTIVITY LIMITATIONS: {activitylimitations:27494}  PARTICIPATION LIMITATIONS: {participationrestrictions:25113}  PERSONAL FACTORS: {Personal factors:25162} are also affecting patient's functional outcome.   REHAB POTENTIAL: Good  CLINICAL DECISION MAKING: Evolving/moderate complexity  EVALUATION COMPLEXITY: Moderate   GOALS: Goals reviewed with patient? Yes  SHORT TERM GOALS: Target date: ***  Independent in initial HEP  Baseline:  Goal status: INITIAL  2.  *** Baseline:  Goal status: INITIAL  3.  *** Baseline:  Goal status: INITIAL   LONG TERM GOALS: Target date: ***  *** Baseline:  Goal status: INITIAL  2.  *** Baseline:  Goal status: INITIAL  3.  *** Baseline:  Goal status: INITIAL  4.  *** Baseline:  Goal status: INITIAL  5.  *** Baseline:  Goal status: INITIAL  6.  *** Baseline:  Goal status: INITIAL   PLAN:  PT FREQUENCY: 2x/week  PT DURATION: 8 weeks  PLANNED INTERVENTIONS: 97164- PT Re-evaluation, 97110-Therapeutic exercises, 97530- Therapeutic activity, 97112- Neuromuscular re-education, 97535- Self Care, 02859- Manual therapy, [phone]- Aquatic Therapy, Patient/Family education, Taping, and Joint mobilization  PLAN FOR NEXT SESSION: review and progress exercises; continue spine care and ergonomic education and correction; manual work and modalities as indicated    Kiera Hussey P Linette Gunderson, PT 11/29/2023, 7:46 PM

## 2023-12-02 ENCOUNTER — Other Ambulatory Visit: Payer: Self-pay

## 2023-12-02 ENCOUNTER — Ambulatory Visit: Attending: Family Medicine | Admitting: Rehabilitative and Restorative Service Providers"

## 2023-12-02 ENCOUNTER — Encounter: Payer: Self-pay | Admitting: Rehabilitative and Restorative Service Providers"

## 2023-12-02 DIAGNOSIS — M539 Dorsopathy, unspecified: Secondary | ICD-10-CM | POA: Diagnosis present

## 2023-12-02 DIAGNOSIS — R202 Paresthesia of skin: Secondary | ICD-10-CM | POA: Insufficient documentation

## 2023-12-02 DIAGNOSIS — R293 Abnormal posture: Secondary | ICD-10-CM | POA: Diagnosis present

## 2023-12-02 DIAGNOSIS — R29898 Other symptoms and signs involving the musculoskeletal system: Secondary | ICD-10-CM | POA: Insufficient documentation

## 2023-12-02 DIAGNOSIS — M549 Dorsalgia, unspecified: Secondary | ICD-10-CM | POA: Insufficient documentation

## 2023-12-02 DIAGNOSIS — R200 Anesthesia of skin: Secondary | ICD-10-CM | POA: Diagnosis not present

## 2023-12-02 DIAGNOSIS — M5410 Radiculopathy, site unspecified: Secondary | ICD-10-CM | POA: Diagnosis present
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
## 2023-12-08 ENCOUNTER — Ambulatory Visit: Admitting: Physical Therapy

## 2023-12-08 ENCOUNTER — Encounter: Payer: Self-pay | Admitting: Physical Therapy

## 2023-12-08 ENCOUNTER — Ambulatory Visit (HOSPITAL_BASED_OUTPATIENT_CLINIC_OR_DEPARTMENT_OTHER)
Admission: RE | Admit: 2023-12-08 | Discharge: 2023-12-08 | Disposition: A | Discharge status: Home / Self Care | Source: Ambulatory Visit | Attending: Family Medicine | Admitting: Family Medicine

## 2023-12-08 DIAGNOSIS — R29898 Other symptoms and signs involving the musculoskeletal system: Secondary | ICD-10-CM

## 2023-12-08 DIAGNOSIS — I351 Nonrheumatic aortic (valve) insufficiency: Secondary | ICD-10-CM | POA: Insufficient documentation

## 2023-12-08 DIAGNOSIS — M539 Dorsopathy, unspecified: Secondary | ICD-10-CM

## 2023-12-08 DIAGNOSIS — R293 Abnormal posture: Secondary | ICD-10-CM

## 2023-12-08 LAB — ECHOCARDIOGRAM COMPLETE
AR max vel: 2.34 cm2
AV Area VTI: 2.39 cm2
AV Area mean vel: 2.38 cm2
AV Mean grad: 3.5 mmHg
AV Peak grad: 6.7 mmHg
AV Vena cont: 0.3 cm
Ao pk vel: 1.3 m/s
Area-P 1/2: 3.88 cm2
Calc EF: 63.3 %
S' Lateral: 1.7 cm
Single Plane A2C EF: 60.7 %
Single Plane A4C EF: 66.1 %

## 2023-12-08 NOTE — Progress Notes (Signed)
 Hi Andrell,   Your echocardiogram shows good pumping function of the heart between 60 and 65% which is normal.  No abnormal motion of the walls which is very reassuring.  Right ventricle size is normal.  The mitral valve looks good.  The aortic valve shows a little bit of backflow that is mild nothing worrisome.  A little dilatation of the ascending aorta but nothing concerning or anything that is consistent with an aneurysm etc.  So overall echocardiogram looks good.

## 2023-12-10 ENCOUNTER — Ambulatory Visit: Admitting: Physical Therapy

## 2023-12-10 ENCOUNTER — Encounter: Payer: Self-pay | Admitting: Physical Therapy

## 2023-12-10 DIAGNOSIS — M539 Dorsopathy, unspecified: Secondary | ICD-10-CM | POA: Diagnosis not present

## 2023-12-10 DIAGNOSIS — R29898 Other symptoms and signs involving the musculoskeletal system: Secondary | ICD-10-CM

## 2023-12-10 DIAGNOSIS — R293 Abnormal posture: Secondary | ICD-10-CM
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
## 2023-12-15 ENCOUNTER — Ambulatory Visit: Admitting: Physical Therapy

## 2023-12-15 ENCOUNTER — Encounter: Payer: Self-pay | Admitting: Physical Therapy

## 2023-12-15 DIAGNOSIS — R29898 Other symptoms and signs involving the musculoskeletal system: Secondary | ICD-10-CM

## 2023-12-15 DIAGNOSIS — M539 Dorsopathy, unspecified: Secondary | ICD-10-CM

## 2023-12-15 DIAGNOSIS — R293 Abnormal posture: Secondary | ICD-10-CM

## 2023-12-17 ENCOUNTER — Ambulatory Visit: Admitting: Rehabilitative and Restorative Service Providers"

## 2023-12-17 ENCOUNTER — Encounter: Payer: Self-pay | Admitting: Rehabilitative and Restorative Service Providers"

## 2023-12-17 DIAGNOSIS — M5410 Radiculopathy, site unspecified: Secondary | ICD-10-CM

## 2023-12-17 DIAGNOSIS — M539 Dorsopathy, unspecified: Secondary | ICD-10-CM | POA: Diagnosis not present

## 2023-12-17 DIAGNOSIS — R29898 Other symptoms and signs involving the musculoskeletal system: Secondary | ICD-10-CM

## 2023-12-17 DIAGNOSIS — R293 Abnormal posture: Secondary | ICD-10-CM
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
## 2023-12-22 ENCOUNTER — Encounter: Payer: Self-pay | Admitting: Rehabilitative and Restorative Service Providers"

## 2023-12-22 ENCOUNTER — Ambulatory Visit: Admitting: Rehabilitative and Restorative Service Providers"

## 2023-12-22 ENCOUNTER — Other Ambulatory Visit: Payer: Self-pay | Admitting: Family Medicine

## 2023-12-22 DIAGNOSIS — R293 Abnormal posture: Secondary | ICD-10-CM

## 2023-12-22 DIAGNOSIS — M539 Dorsopathy, unspecified: Secondary | ICD-10-CM | POA: Diagnosis not present

## 2023-12-22 DIAGNOSIS — R42 Dizziness and giddiness: Secondary | ICD-10-CM

## 2023-12-22 DIAGNOSIS — R29898 Other symptoms and signs involving the musculoskeletal system: Secondary | ICD-10-CM

## 2023-12-22 DIAGNOSIS — M5410 Radiculopathy, site unspecified: Secondary | ICD-10-CM

## 2023-12-22 NOTE — Progress Notes (Signed)
 Orders Placed This Encounter  Procedures  . Ambulatory referral to Physical Therapy    Referral Priority:   Routine    Referral Type:   Physical Medicine    Referral Reason:   Specialty Services Required    Requested Specialty:   Physical Therapy    Number of Visits Requested:   1
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
## 2023-12-24 ENCOUNTER — Ambulatory Visit: Admitting: Rehabilitative and Restorative Service Providers"

## 2023-12-25 ENCOUNTER — Encounter: Payer: Self-pay | Admitting: Rehabilitative and Restorative Service Providers"

## 2023-12-25 ENCOUNTER — Ambulatory Visit: Attending: Family Medicine | Admitting: Rehabilitative and Restorative Service Providers"

## 2023-12-25 DIAGNOSIS — M539 Dorsopathy, unspecified: Secondary | ICD-10-CM | POA: Insufficient documentation

## 2023-12-25 DIAGNOSIS — R42 Dizziness and giddiness: Secondary | ICD-10-CM | POA: Insufficient documentation

## 2023-12-25 DIAGNOSIS — R293 Abnormal posture: Secondary | ICD-10-CM | POA: Diagnosis present

## 2023-12-25 DIAGNOSIS — R29898 Other symptoms and signs involving the musculoskeletal system: Secondary | ICD-10-CM | POA: Diagnosis present

## 2023-12-25 DIAGNOSIS — H8113 Benign paroxysmal vertigo, bilateral: Secondary | ICD-10-CM | POA: Insufficient documentation
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
## 2023-12-29 ENCOUNTER — Encounter: Payer: Self-pay | Admitting: Rehabilitative and Restorative Service Providers"

## 2023-12-29 ENCOUNTER — Ambulatory Visit: Admitting: Rehabilitative and Restorative Service Providers"

## 2023-12-29 DIAGNOSIS — M539 Dorsopathy, unspecified: Secondary | ICD-10-CM | POA: Diagnosis not present

## 2023-12-29 DIAGNOSIS — R29898 Other symptoms and signs involving the musculoskeletal system: Secondary | ICD-10-CM

## 2023-12-29 DIAGNOSIS — R293 Abnormal posture: Secondary | ICD-10-CM
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
## 2023-12-31 ENCOUNTER — Encounter: Payer: Self-pay | Admitting: Physical Therapy

## 2023-12-31 ENCOUNTER — Ambulatory Visit: Admitting: Physical Therapy

## 2023-12-31 DIAGNOSIS — R293 Abnormal posture: Secondary | ICD-10-CM

## 2023-12-31 DIAGNOSIS — M539 Dorsopathy, unspecified: Secondary | ICD-10-CM

## 2023-12-31 DIAGNOSIS — R42 Dizziness and giddiness: Secondary | ICD-10-CM

## 2023-12-31 DIAGNOSIS — R29898 Other symptoms and signs involving the musculoskeletal system: Secondary | ICD-10-CM

## 2024-01-05 ENCOUNTER — Ambulatory Visit: Admitting: Physical Therapy

## 2024-01-07 ENCOUNTER — Ambulatory Visit: Admitting: Physical Therapy

## 2024-01-12 ENCOUNTER — Ambulatory Visit: Admitting: Rehabilitative and Restorative Service Providers"

## 2024-01-12 ENCOUNTER — Encounter: Payer: Self-pay | Admitting: Rehabilitative and Restorative Service Providers"

## 2024-01-12 DIAGNOSIS — M539 Dorsopathy, unspecified: Secondary | ICD-10-CM | POA: Diagnosis not present

## 2024-01-12 DIAGNOSIS — R29898 Other symptoms and signs involving the musculoskeletal system: Secondary | ICD-10-CM

## 2024-01-12 DIAGNOSIS — R293 Abnormal posture: Secondary | ICD-10-CM

## 2024-01-12 DIAGNOSIS — R42 Dizziness and giddiness: Secondary | ICD-10-CM
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
## 2024-01-14 ENCOUNTER — Ambulatory Visit: Admitting: Rehabilitative and Restorative Service Providers"

## 2024-01-14 ENCOUNTER — Encounter: Payer: Self-pay | Admitting: Rehabilitative and Restorative Service Providers"

## 2024-01-14 DIAGNOSIS — R29898 Other symptoms and signs involving the musculoskeletal system: Secondary | ICD-10-CM

## 2024-01-14 DIAGNOSIS — R293 Abnormal posture: Secondary | ICD-10-CM

## 2024-01-14 DIAGNOSIS — M539 Dorsopathy, unspecified: Secondary | ICD-10-CM | POA: Diagnosis not present
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
## 2024-01-19 ENCOUNTER — Ambulatory Visit: Admitting: Physical Therapy

## 2024-01-19 ENCOUNTER — Encounter: Payer: Self-pay | Admitting: Physical Therapy

## 2024-01-19 DIAGNOSIS — M539 Dorsopathy, unspecified: Secondary | ICD-10-CM

## 2024-01-19 DIAGNOSIS — R42 Dizziness and giddiness: Secondary | ICD-10-CM

## 2024-01-19 DIAGNOSIS — R29898 Other symptoms and signs involving the musculoskeletal system: Secondary | ICD-10-CM

## 2024-01-19 DIAGNOSIS — R293 Abnormal posture: Secondary | ICD-10-CM

## 2024-01-21 ENCOUNTER — Ambulatory Visit: Admitting: Physical Therapy

## 2024-03-03 ENCOUNTER — Encounter: Payer: Self-pay | Admitting: Family Medicine

## 2024-03-03 ENCOUNTER — Ambulatory Visit (INDEPENDENT_AMBULATORY_CARE_PROVIDER_SITE_OTHER): Admitting: Family Medicine

## 2024-03-03 DIAGNOSIS — J101 Influenza due to other identified influenza virus with other respiratory manifestations: Secondary | ICD-10-CM

## 2024-03-03 DIAGNOSIS — R051 Acute cough: Secondary | ICD-10-CM

## 2024-03-03 LAB — POC SOFIA 2 FLU + SARS ANTIGEN FIA
Influenza A, POC: NEGATIVE
Influenza B, POC: POSITIVE — AB
SARS Coronavirus 2 Ag: NEGATIVE

## 2024-03-03 MED ORDER — OSELTAMIVIR PHOSPHATE 75 MG PO CAPS: 75.0000 mg | ORAL_CAPSULE | Freq: Two times a day (BID) | ORAL | 0 refills | Status: AC

## 2024-03-03 NOTE — Progress Notes (Signed)
 Acute Office Visit  Patient ID: Pamela Stevens, female    DOB: 01-19-1971, 53 y.o.   MRN: [id number]  PCP: Alvan Dorothyann BIRCH, MD  Chief Complaint  Patient presents with   Cough   Sore Throat   URI    Subjective:     HPI  Discussed the use of AI scribe software for clinical note transcription with the patient, who gave verbal consent to proceed.  History of Present Illness Pamela Stevens is a 53 year old female who presents with symptoms suggestive of influenza.  Acute respiratory symptoms - Onset approximately 4-5 days ago with progressive worsening since Sunday - Fever, chills, and sweats present - Persistent cough - Chest discomfort described as feeling like she has to 'push against something to breathe' - No wheezing - Intermittent ear pain, attributed to pressure changes related to underlying vestibular condition - Notable sore throat - Lack of appetite since onset of illness - Cough medicines have not been effective in the past - Managing symptoms with water, tea, and cough drops  Recent gastrointestinal symptoms - Vomiting and diarrhea began the night of Thanksgiving - Symptoms consistent with norovirus - Gastrointestinal symptoms have resolved   ROS     Objective:    BP 126/83   Pulse 90   Temp 98.6 F (37 C) (Oral)   SpO2 100%    Physical Exam Constitutional:      Appearance: Normal appearance.  HENT:     Head: Normocephalic and atraumatic.     Right Ear: Tympanic membrane, ear canal and external ear normal. There is no impacted cerumen.     Left Ear: Tympanic membrane, ear canal and external ear normal. There is no impacted cerumen.     Nose: Nose normal.     Mouth/Throat:     Pharynx: Oropharynx is clear.     Tonsils: No tonsillar exudate.  Eyes:     Conjunctiva/sclera: Conjunctivae normal.  Cardiovascular:     Rate and Rhythm: Normal rate and regular rhythm.  Pulmonary:     Effort: Pulmonary effort is normal.      Breath sounds: Normal breath sounds.  Musculoskeletal:     Cervical back: Neck supple. No tenderness.  Lymphadenopathy:     Cervical: No cervical adenopathy.  Skin:    General: Skin is warm and dry.  Neurological:     Mental Status: She is alert and oriented to person, place, and time.  Psychiatric:        Mood and Affect: Mood normal.       Results for orders placed or performed in visit on 03/03/24  POC SOFIA 2 FLU + SARS ANTIGEN FIA  Result Value Ref Range   Influenza A, POC Negative Negative   Influenza B, POC Positive (A) Negative   SARS Coronavirus 2 Ag Negative Negative       Assessment & Plan:   Problem List Items Addressed This Visit   None Visit Diagnoses       Acute cough    -  Primary   Relevant Orders   POC SOFIA 2 FLU + SARS ANTIGEN FIA (Completed)     Influenza B       Relevant Medications   oseltamivir (TAMIFLU) 75 MG capsule       Assessment and Plan Assessment & Plan Influenza B Acute influenza B confirmed by positive swab. Symptoms include fever, chills, sweats, cough, sore throat, and chest discomfort without significant respiratory distress. - Prescribed Tamiflu. - Advised mask  use to prevent transmission. - Recommended caution around others. - Instructed to monitor symptoms and seek evaluation if cough persists beyond one week.    Meds ordered this encounter  Medications   oseltamivir (TAMIFLU) 75 MG capsule    Sig: Take 1 capsule (75 mg total) by mouth 2 (two) times daily.    Dispense:  10 capsule    Refill:  0    No follow-ups on file.  Dorothyann Byars, MD [facility] at [facility]
